# Patient Record
Sex: Male | Born: 1960 | Race: White | Hispanic: No | Marital: Married | Smoking: Former smoker
Health system: Southern US, Community
[De-identification: ages and names within clinical notes are randomized; demographics above are authoritative.]

## PROBLEM LIST (undated history)

## (undated) DIAGNOSIS — Z9889 Other specified postprocedural states: Secondary | ICD-10-CM

## (undated) DIAGNOSIS — I10 Essential (primary) hypertension: Secondary | ICD-10-CM

## (undated) DIAGNOSIS — E782 Mixed hyperlipidemia: Secondary | ICD-10-CM

## (undated) HISTORY — PX: OTHER SURGICAL HISTORY: SHX169

## (undated) HISTORY — DX: Essential (primary) hypertension: I10

## (undated) HISTORY — DX: Mixed hyperlipidemia: E78.2

## (undated) HISTORY — DX: Other specified postprocedural states: Z98.890

## (undated) HISTORY — PX: COLONOSCOPY W/ POLYPECTOMY: SHX1380

## (undated) HISTORY — PX: TONSILLECTOMY: SUR1361

## (undated) HISTORY — PX: SHOULDER ARTHROSCOPY: SHX128

---

## 1999-01-06 ENCOUNTER — Other Ambulatory Visit: Admission: RE | Admit: 1999-01-06 | Discharge: 1999-01-06 | Payer: Self-pay | Admitting: Otolaryngology

## 2005-07-31 ENCOUNTER — Encounter: Admission: RE | Admit: 2005-07-31 | Discharge: 2005-07-31 | Payer: Self-pay | Admitting: Internal Medicine

## 2006-07-04 ENCOUNTER — Inpatient Hospital Stay (HOSPITAL_COMMUNITY): Admission: RE | Admit: 2006-07-04 | Discharge: 2006-07-05 | Payer: Self-pay | Admitting: Specialist

## 2013-08-20 DIAGNOSIS — Z9889 Other specified postprocedural states: Secondary | ICD-10-CM

## 2013-08-20 HISTORY — DX: Other specified postprocedural states: Z98.890

## 2013-11-25 ENCOUNTER — Encounter: Payer: Self-pay | Admitting: Interventional Cardiology

## 2013-12-30 ENCOUNTER — Ambulatory Visit: Payer: Self-pay | Admitting: Interventional Cardiology

## 2014-01-21 ENCOUNTER — Ambulatory Visit (INDEPENDENT_AMBULATORY_CARE_PROVIDER_SITE_OTHER): Payer: BC Managed Care – PPO | Admitting: Interventional Cardiology

## 2014-01-21 ENCOUNTER — Encounter: Payer: Self-pay | Admitting: Interventional Cardiology

## 2014-01-21 VITALS — BP 142/82 | HR 85 | Ht 68.0 in | Wt 189.8 lb

## 2014-01-21 DIAGNOSIS — E782 Mixed hyperlipidemia: Secondary | ICD-10-CM | POA: Insufficient documentation

## 2014-01-21 DIAGNOSIS — Z8249 Family history of ischemic heart disease and other diseases of the circulatory system: Secondary | ICD-10-CM

## 2014-01-21 DIAGNOSIS — F17200 Nicotine dependence, unspecified, uncomplicated: Secondary | ICD-10-CM

## 2014-01-21 HISTORY — DX: Mixed hyperlipidemia: E78.2

## 2014-01-21 MED ORDER — FISH OIL 1000 MG PO CAPS: 2.0000 | ORAL_CAPSULE | Freq: Two times a day (BID) | ORAL | Status: AC

## 2014-01-21 NOTE — Progress Notes (Signed)
Patient ID: Ernest [name], male   DOB: 05/02/1961, 53 y.o.   MRN: [id number]    [address] [place], [place]  [address] Phone: [phone] Fax:  [phone]  Date:  01/21/2014   ID:  Ernest BrillRonald S [name], DOB 08/01/1961, MRN [id number]  PCP:  Darnelle BosSBORNE,JAMES CHARLES, MD      History of Present Illness: Ernest [name] is a 53 y.o. male who has had a strong family h/o CAD. His father died of an MI at age 54. His father was a heavy smoker. A year ago he had some left sided chest discomfort that would come and go quickly. It was on and off for over a day. No pain with climbing poles or walking.  He had an ETT and went 10 minutes without sx or ECG changes.   He has resumed smoking in 11/14.    Wt Readings from Last 3 Encounters:  01/21/14 189 lb 12.8 oz (86.093 kg)     Past Medical History  Diagnosis Date  . Hypertension     Current Outpatient Prescriptions  Medication Sig Dispense Refill  . aspirin 81 MG tablet Take 81 mg by mouth daily.      Marland Kitchen. atorvastatin (LIPITOR) 10 MG tablet Take 10 mg by mouth daily.      Marland Kitchen. buPROPion (WELLBUTRIN SR) 150 MG 12 hr tablet Take 150 mg by mouth 2 (two) times daily.      Marland Kitchen. zolpidem (AMBIEN) 5 MG tablet Take 5 mg by mouth at bedtime as needed for sleep.       No current facility-administered medications for this visit.    Allergies:   No Known Allergies  Social History:  The patient  reports that he has been smoking.  He does not have any smokeless tobacco history on file. He reports that he does not drink alcohol or use illicit drugs.   Family History:  The patient's family history includes Heart attack in his father and paternal uncle; Heart disease in his father and paternal uncle.   ROS:  Please see the history of present illness.  No nausea, vomiting.  No fevers, chills.  No focal weakness.  No dysuria.    All other systems reviewed and negative.   PHYSICAL EXAM: VS:  BP 142/82  Pulse 85  Ht 5\' 8"  (1.727 m)  Wt 189 lb  12.8 oz (86.093 kg)  BMI 28.87 kg/m2 Well nourished, well developed, in no acute distress HEENT: normal Neck: no JVD, no carotid bruits Cardiac:  normal S1, S2; RRR;  Lungs:  clear to auscultation bilaterally, no wheezing, rhonchi or rales Abd: soft, nontender, no hepatomegaly Ext: no edema Skin: warm and dry Neuro:   no focal abnormalities noted  EKG:  Normal   ASSESSMENT AND PLAN:  Chest pain  IMAGING: EC Stress Test [facility] in 2014 Notes: Atypical chest pain resolved. RF for CAD. Negative ETT in 2014, 10 minutes on treadmill.Marland Kitchen.  He will let us know if he has any further symptoms.    2. Hypercholesteremia  Continue Atorvastatin Calcium Tablet, 10 MG, 1 tablet, Orally, Once a day Notes: Would try to get LDL < 100. TG 343 and LDL 158 down to 94 at last check in 9/14.  But TG was 485 at that time.  Start fish oil 2 grams PO BID.   lipids will be checked with Dr. Kevan NyGates in a few months.    3. Family history of coronary artery disease /tobacco abuse Notes: Significant RF  for CAD. Father was a smoker as well. Patient is smoking. I encouraged him to abstain.  We spoke about tools to help him quit stop smoking. He wants to try cold [place]. He is unsuccessfully in the past. He feels that once the stress from his  Daughter has resolved, he will be able to stop smoking. [place].    Preventive Medicine  Adult topics discussed:  Diet: healthy diet.  Exercise: 5 days a week, at least 30 minutes of aerobic exercise.      Signed, Fredric MareJay S. Hans Rusher, MD, [facility] 01/21/2014 4:42 PM

## 2014-01-21 NOTE — Patient Instructions (Signed)
Your physician recommends that you return for a FASTING lipid profile with PCP as scheduled and have them fax results to us at [phone].  Your physician has recommended you make the following change in your medication:  1. Start Fish Oil 1,000 mg 2 capsules twice a day.  Your physician wants you to follow-up in: 1 year with Dr. Eldridge DaceVaranasi. You will receive a reminder letter in the mail two months in advance. If you don't receive a letter, please call our office to schedule the follow-up appointment.

## 2014-01-22 ENCOUNTER — Encounter: Payer: Self-pay | Admitting: Interventional Cardiology

## 2014-01-22 ENCOUNTER — Encounter: Payer: Self-pay | Admitting: Cardiology

## 2015-01-17 ENCOUNTER — Emergency Department (HOSPITAL_COMMUNITY)
Admission: EM | Admit: 2015-01-17 | Discharge: 2015-01-17 | Disposition: A | Discharge status: Home / Self Care | Payer: BLUE CROSS/BLUE SHIELD | Attending: Emergency Medicine | Admitting: Emergency Medicine

## 2015-01-17 ENCOUNTER — Emergency Department (HOSPITAL_COMMUNITY): Payer: BLUE CROSS/BLUE SHIELD

## 2015-01-17 ENCOUNTER — Encounter (HOSPITAL_COMMUNITY): Payer: Self-pay | Admitting: Emergency Medicine

## 2015-01-17 DIAGNOSIS — Z720 Tobacco use: Secondary | ICD-10-CM | POA: Insufficient documentation

## 2015-01-17 DIAGNOSIS — I10 Essential (primary) hypertension: Secondary | ICD-10-CM | POA: Insufficient documentation

## 2015-01-17 DIAGNOSIS — K5733 Diverticulitis of large intestine without perforation or abscess with bleeding: Secondary | ICD-10-CM | POA: Diagnosis not present

## 2015-01-17 DIAGNOSIS — E782 Mixed hyperlipidemia: Secondary | ICD-10-CM | POA: Diagnosis not present

## 2015-01-17 DIAGNOSIS — Z79899 Other long term (current) drug therapy: Secondary | ICD-10-CM | POA: Diagnosis not present

## 2015-01-17 DIAGNOSIS — K625 Hemorrhage of anus and rectum: Secondary | ICD-10-CM | POA: Diagnosis present

## 2015-01-17 DIAGNOSIS — K6289 Other specified diseases of anus and rectum: Secondary | ICD-10-CM

## 2015-01-17 LAB — COMPREHENSIVE METABOLIC PANEL
ALT: 25 U/L (ref 0–53)
AST: 21 U/L (ref 0–37)
Albumin: 3.8 g/dL (ref 3.5–5.2)
Alkaline Phosphatase: 89 U/L (ref 39–117)
Anion gap: 8 (ref 5–15)
BILIRUBIN TOTAL: 0.7 mg/dL (ref 0.3–1.2)
BUN: 7 mg/dL (ref 6–23)
CO2: 24 mmol/L (ref 19–32)
Calcium: 9.4 mg/dL (ref 8.4–10.5)
Chloride: 106 mmol/L (ref 96–112)
Creatinine, Ser: 1.11 mg/dL (ref 0.50–1.35)
GFR calc Af Amer: 85 mL/min — ABNORMAL LOW (ref >90)
GFR calc non Af Amer: 74 mL/min — ABNORMAL LOW (ref >90)
Glucose, Bld: 120 mg/dL — ABNORMAL HIGH (ref 70–99)
Potassium: 4.2 mmol/L (ref 3.5–5.1)
Sodium: 138 mmol/L (ref 135–145)
TOTAL PROTEIN: 6.3 g/dL (ref 6.0–8.3)

## 2015-01-17 LAB — CBC WITH DIFFERENTIAL/PLATELET
BASOS PCT: 0 % (ref 0–1)
Basophils Absolute: 0 10*3/uL (ref 0.0–0.1)
EOS PCT: 1 % (ref 0–5)
Eosinophils Absolute: 0.1 10*3/uL (ref 0.0–0.7)
HCT: 45.1 % (ref 39.0–52.0)
Hemoglobin: 15.8 g/dL (ref 13.0–17.0)
Lymphocytes Relative: 15 % (ref 12–46)
Lymphs Abs: 1.4 10*3/uL (ref 0.7–4.0)
MCH: 30.9 pg (ref 26.0–34.0)
MCHC: 35 g/dL (ref 30.0–36.0)
MCV: 88.3 fL (ref 78.0–100.0)
Monocytes Absolute: 0.5 10*3/uL (ref 0.1–1.0)
Monocytes Relative: 6 % (ref 3–12)
NEUTROS ABS: 7.2 10*3/uL (ref 1.7–7.7)
NEUTROS PCT: 78 % — AB (ref 43–77)
Platelets: 196 10*3/uL (ref 150–400)
RBC: 5.11 MIL/uL (ref 4.22–5.81)
RDW: 11.7 % (ref 11.5–15.5)
WBC: 9.2 10*3/uL (ref 4.0–10.5)

## 2015-01-17 LAB — LIPASE, BLOOD: Lipase: 102 U/L — ABNORMAL HIGH (ref 11–59)

## 2015-01-17 LAB — POC OCCULT BLOOD, ED: Fecal Occult Bld: POSITIVE — AB

## 2015-01-17 MED ORDER — ONDANSETRON HCL 4 MG PO TABS: 4.0000 mg | ORAL_TABLET | Freq: Four times a day (QID) | ORAL | Status: DC

## 2015-01-17 MED ORDER — ONDANSETRON HCL 4 MG/2ML IJ SOLN
4.0000 mg | Freq: Once | INTRAMUSCULAR | Status: AC
  Administered 2015-01-17: 4 mg via INTRAVENOUS
  Filled 2015-01-17: qty 2

## 2015-01-17 MED ORDER — IOHEXOL 300 MG/ML  SOLN
25.0000 mL | Freq: Once | INTRAMUSCULAR | Status: AC | PRN
  Administered 2015-01-17: 25 mL via ORAL

## 2015-01-17 MED ORDER — IOHEXOL 300 MG/ML  SOLN
100.0000 mL | Freq: Once | INTRAMUSCULAR | Status: AC | PRN
  Administered 2015-01-17: 100 mL via INTRAVENOUS

## 2015-01-17 MED ORDER — MORPHINE SULFATE 4 MG/ML IJ SOLN
4.0000 mg | INTRAMUSCULAR | Status: DC | PRN
  Administered 2015-01-17: 4 mg via INTRAVENOUS
  Filled 2015-01-17: qty 1

## 2015-01-17 MED ORDER — METRONIDAZOLE 500 MG PO TABS: 500.0000 mg | ORAL_TABLET | Freq: Three times a day (TID) | ORAL | Status: DC

## 2015-01-17 MED ORDER — HYDROCODONE-ACETAMINOPHEN 5-325 MG PO TABS: 1.0000 | ORAL_TABLET | ORAL | Status: DC | PRN

## 2015-01-17 MED ORDER — CIPROFLOXACIN HCL 500 MG PO TABS: 500.0000 mg | ORAL_TABLET | Freq: Two times a day (BID) | ORAL | Status: DC

## 2015-01-17 MED ORDER — SODIUM CHLORIDE 0.9 % IV BOLUS (SEPSIS)
1000.0000 mL | Freq: Once | INTRAVENOUS | Status: AC
  Administered 2015-01-17: 1000 mL via INTRAVENOUS

## 2015-01-17 NOTE — ED Notes (Signed)
Notified CT patient finished with PO contrast. 

## 2015-01-17 NOTE — ED Notes (Signed)
Patient returned from CT

## 2015-01-17 NOTE — Discharge Instructions (Signed)
Return to the emergency room with worsening of symptoms, new symptoms or with symptoms that are concerning , especially fevers, worse abdominal pain in one area, unable to keep down fluids, gross/frank blood in stool or vomit, severe pain, you feel faint, lightheaded or pass out. Please take all of your antibiotics until finished!   You may develop abdominal discomfort or diarrhea from the antibiotic.  You may help offset this with probiotics which you can buy or get in yogurt. Do not eat  or take the probiotics until 2 hours after your antibiotic.  Please call your doctor for a followup appointment within 24-48 hours. When you talk to your doctor please let them know that you were seen in the emergency department and have them acquire all of your records so that they can discuss the findings with you and formulate a treatment plan to fully care for your new and ongoing problems. If you do not have a primary care provider please call the number below under ED resources to establish care with a provider and follow up.  Make appointment as soon as possible for follow-up with [facility] for next week.  Read below information and follow recommendations. Diverticulitis Diverticulitis is inflammation or infection of small pouches in your colon that form when you have a condition called diverticulosis. The pouches in your colon are called diverticula. Your colon, or large intestine, is where water is absorbed and stool is formed. Complications of diverticulitis can include:  Bleeding.  Severe infection.  Severe pain.  Perforation of your colon.  Obstruction of your colon. CAUSES  Diverticulitis is caused by bacteria. Diverticulitis happens when stool becomes trapped in diverticula. This allows bacteria to grow in the diverticula, which can lead to inflammation and infection. RISK FACTORS People with diverticulosis are at risk for diverticulitis. Eating a diet that does not include enough fiber from  fruits and vegetables may make diverticulitis more likely to develop. SYMPTOMS  Symptoms of diverticulitis may include:  Abdominal pain and tenderness. The pain is normally located on the left side of the abdomen, but may occur in other areas.  Fever and chills.  Bloating.  Cramping.  Nausea.  Vomiting.  Constipation.  Diarrhea.  Blood in your stool. DIAGNOSIS  Your health care provider will ask you about your medical history and do a physical exam. You may need to have tests done because many medical conditions can cause the same symptoms as diverticulitis. Tests may include:  Blood tests.  Urine tests.  Imaging tests of the abdomen, including X-rays and CT scans. When your condition is under control, your health care provider may recommend that you have a colonoscopy. A colonoscopy can show how severe your diverticula are and whether something else is causing your symptoms. TREATMENT  Most cases of diverticulitis are mild and can be treated at home. Treatment may include:  Taking over-the-counter pain medicines.  Following a clear liquid diet.  Taking antibiotic medicines by mouth for 7-10 days. More severe cases may be treated at a hospital. Treatment may include:  Not eating or drinking.  Taking prescription pain medicine.  Receiving antibiotic medicines through an IV tube.  Receiving fluids and nutrition through an IV tube.  Surgery. HOME CARE INSTRUCTIONS   Follow your health care provider's instructions carefully.  Follow a full liquid diet or other diet as directed by your health care provider. After your symptoms improve, your health care provider may tell you to change your diet. He or she may recommend you  eat a high-fiber diet. Fruits and vegetables are good sources of fiber. Fiber makes it easier to pass stool.  Take fiber supplements or probiotics as directed by your health care provider.  Only take medicines as directed by your health care  provider.  Keep all your follow-up appointments. SEEK MEDICAL CARE IF:   Your pain does not improve.  You have a hard time eating food.  Your bowel movements do not return to normal. SEEK IMMEDIATE MEDICAL CARE IF:   Your pain becomes worse.  Your symptoms do not get better.  Your symptoms suddenly get worse.  You have a fever.  You have repeated vomiting.  You have bloody or black, tarry stools. MAKE SURE YOU:   Understand these instructions.  Will watch your condition.  Will get help right away if you are not doing well or get worse. Document Released: 06/21/2005 Document Revised: 09/16/2013 Document Reviewed: 08/06/2013 [facility] Patient Information 2015 [place], [place]. This information is not intended to replace advice given to you by your health care provider. Make sure you discuss any questions you have with your health care provider.

## 2015-01-17 NOTE — ED Provider Notes (Signed)
CSN: [id number]     Arrival date & time 01/17/15  1113 History   First MD Initiated Contact with Patient 01/17/15 1122     Chief Complaint  Patient presents with  . Rectal Bleeding     (Consider location/radiation/quality/duration/timing/severity/associated sxs/prior Treatment) HPI  Ernest [name] is a [age] y.o. male with PMH of hypertension, dyslipidemia, IBS presenting with lower abdominal cramping at 2 AM followed by black stool. Patient reported looser stool every 30 minutes that was dark and at 5 AM became bright red. Abdominal pain perceived diarrhea resolves. He has not taken anything for his symptoms. Patient endorses taking Pepto-Bismol yesterday but is not taking the iron pills. Patient reports intermittent nausea but no vomiting. He's never had rectal bleeding, hemorrhoids. He denies any rectal pain. Denies any pain at this time. He is not on any blood thinners. Denies history of GI bleeds. No NSAID use or alcohol use. Patient reports last colonoscopy 1-2 years ago was unremarkable. He denies history of abdominal surgeries.   Past Medical History  Diagnosis Date  . Hypertension   . Mixed hyperlipidemia 01/21/2014  . History of colonoscopy 08/20/13   Past Surgical History  Procedure Laterality Date  . Shoulder arthroscopy    . Tonselec      tonsillectomy   Family History  Problem Relation Age of Onset  . Heart disease Father   . Heart attack Father   . Heart disease Paternal Uncle   . Heart attack Paternal Uncle    History  Substance Use Topics  . Smoking status: Current Every Day Smoker -- 1.00 packs/day  . Smokeless tobacco: Not on file  . Alcohol Use: No    Review of Systems 10 Systems reviewed and are negative for acute change except as noted in the HPI.    Allergies  Review of patient's allergies indicates no known allergies.  Home Medications   Prior to Admission medications   Medication Sig Start Date End Date Taking? Authorizing Provider  aspirin  81 MG tablet Take 81 mg by mouth daily.   Yes Historical Provider, MD  atorvastatin (LIPITOR) 10 MG tablet Take 10 mg by mouth daily.   Yes Historical Provider, MD  buPROPion (WELLBUTRIN SR) 150 MG 12 hr tablet Take 150 mg by mouth 2 (two) times daily.   Yes Historical Provider, MD  levothyroxine (SYNTHROID, LEVOTHROID) 75 MCG tablet Take 75 mcg by mouth daily. 12/29/14  Yes Historical Provider, MD  Omega-3 Fatty Acids (FISH OIL) 1000 MG CAPS Take 2 capsules (2,000 mg total) by mouth 2 (two) times daily. 01/21/14  Yes Corky CraftsJayadeep S Varanasi, MD  zolpidem (AMBIEN) 5 MG tablet Take 5 mg by mouth at bedtime as needed for sleep.   Yes Historical Provider, MD  ciprofloxacin (CIPRO) 500 MG tablet Take 1 tablet (500 mg total) by mouth 2 (two) times daily. 01/17/15   Oswaldo ConroyVictoria Anndrea Mihelich, PA-C  HYDROcodone-acetaminophen (NORCO/VICODIN) 5-325 MG per tablet Take 1-2 tablets by mouth every 4 (four) hours as needed. 01/17/15   Oswaldo ConroyVictoria Nitasha Jewel, PA-C  metroNIDAZOLE (FLAGYL) 500 MG tablet Take 1 tablet (500 mg total) by mouth 3 (three) times daily. 01/17/15   Oswaldo ConroyVictoria Rylynne Schicker, PA-C  ondansetron (ZOFRAN) 4 MG tablet Take 1 tablet (4 mg total) by mouth every 6 (six) hours. 01/17/15   Oswaldo ConroyVictoria Agam Tuohy, PA-C   BP 142/83 mmHg  Pulse 64  Temp(Src) 98 F (36.7 C) (Oral)  Resp 13  Ht 5\' 10"  (1.778 m)  Wt 192 lb (87.091 kg)  BMI 27.55 kg/m2  SpO2 94% Physical Exam  Constitutional: He appears well-developed and well-nourished. No distress.  HENT:  Head: Normocephalic and atraumatic.  Dry mucous membranes  Eyes: Conjunctivae and EOM are normal. Right eye exhibits no discharge. Left eye exhibits no discharge.  Cardiovascular: Normal rate and regular rhythm.   Pulmonary/Chest: Effort normal and breath sounds normal. No respiratory distress. He has no wheezes.  Abdominal: Soft. Bowel sounds are normal. He exhibits no distension.  Right and left lower quadrant abdominal tenderness without rebound, rigidity, guarding.  Genitourinary:   External rectum without tenderness to palpation without visible fissures. Internal rectum with normal tone without palpated masses or lesions. Stool brown without gross blood. Nurse in room during exam.   Neurological: He is alert. He exhibits normal muscle tone. Coordination normal.  Skin: Skin is warm and dry. He is not diaphoretic.  Nursing note and vitals reviewed.   ED Course  Procedures (including critical care time) Labs Review Labs Reviewed  CBC WITH DIFFERENTIAL/PLATELET - Abnormal; Notable for the following:    Neutrophils Relative % 78 (*)    All other components within normal limits  COMPREHENSIVE METABOLIC PANEL - Abnormal; Notable for the following:    Glucose, Bld 120 (*)    GFR calc non Af Amer 74 (*)    GFR calc Af Amer 85 (*)    All other components within normal limits  LIPASE, BLOOD - Abnormal; Notable for the following:    Lipase 102 (*)    All other components within normal limits  POC OCCULT BLOOD, ED - Abnormal; Notable for the following:    Fecal Occult Bld POSITIVE (*)    All other components within normal limits    Imaging Review Ct Abdomen Pelvis W Contrast  01/17/2015   CLINICAL DATA:  Rectal bleeding. Lower abdominal pain with bowel movements.  EXAM: CT ABDOMEN AND PELVIS WITH CONTRAST  TECHNIQUE: Multidetector CT imaging of the abdomen and pelvis was performed using the standard protocol following bolus administration of intravenous contrast.  CONTRAST:  OMNIPAQUE IOHEXOL 300 MG/ML  SOLN  COMPARISON:  None.  FINDINGS: Mild diffuse low density of the liver relative to the spleen. Small to moderate-sized sliding hiatal hernia.  Concentric wall thickening involving the distal descending and proximal sigmoid colon with questionable minimal adjacent pericolonic soft tissue stranding. Questionable small diverticula in that region as well as elsewhere in the colon. No fluid collections or free peritoneal air.  There is also a suggestion of an eccentric mass  in the mid to upper rectum posteriorly and on the right. This is best seen on axial image number 65 and coronal image number 95.  Normal appearing appendix. No small bowel abnormalities. No enlarged lymph nodes.  Normal appearing spleen, pancreas, gallbladder, right adrenal glands, right kidney and urinary bladder. Mildly enlarged prostate gland containing a small amount of coarse calcification. Tiny left renal cysts. Left adrenal mass measuring 2.0 x 1.9 cm on image 19.  Of the minimal dependent atelectasis at both lung bases. Mild lumbar and lower thoracic spine degenerative changes. Right L5 pars interarticularis defect. Mild anterolisthesis at the L5-S1 level. The left L5 pars interarticularis is intact.  IMPRESSION: 1. Possible minimal segmental colitis or diverticulitis involving the distal descending colon and proximal sigmoid colon. 2. Possible eccentric mass in the mid mid to upper rectum, concerning for the possibility of a rectal neoplasm. Further evaluation with sigmoidoscopy or colonoscopy is recommended to evaluate this area as well as the distal descending and proximal sigmoid colon. 3.  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 A metastasis is less likely. 4. Small to moderate-sized hiatal hernia. 5. Mild diffuse hepatic steatosis. 6. 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   Electronically Signed   By: Beckie Salts M.D.   On: 01/17/2015 14:23     EKG Interpretation None      Meds given in ED:  Medications  morphine 4 MG/ML injection 4 mg (4 mg Intravenous Given 01/17/15 1321)  sodium chloride 0.9 % bolus 1,000 mL (0 mLs Intravenous Stopped 01/17/15 1323)  ondansetron (ZOFRAN) injection 4 mg (4 mg Intravenous Given 01/17/15 1146)  iohexol (OMNIPAQUE) 300 MG/ML solution 25 mL (25 mLs Oral Contrast Given 01/17/15 1201)  iohexol (OMNIPAQUE) 300 MG/ML solution 100 mL (100 mLs Intravenous Contrast Given 01/17/15 1254)     New Prescriptions   CIPROFLOXACIN (CIPRO) 500 MG TABLET    Take 1 tablet (500 mg total) by mouth 2 (two) times daily.   HYDROCODONE-ACETAMINOPHEN (NORCO/VICODIN) 5-325 MG PER TABLET    Take 1-2 tablets by mouth every 4 (four) hours as needed.   METRONIDAZOLE (FLAGYL) 500 MG TABLET    Take 1 tablet (500 mg total) by mouth 3 (three) times daily.   ONDANSETRON (ZOFRAN) 4 MG TABLET    Take 1 tablet (4 mg total) by mouth every 6 (six) hours.      MDM   Final diagnoses:  Diverticulitis of large intestine without perforation or abscess with bleeding  Rectal mass   Patient presenting with one-day history of diarrhea and rectal bleeding with lower abdominal pain. VSS. She and afebrile. Abdomen with minimal lower abdominal tenderness without any evidence of peritonitis. Laboratory workup significant for lipase 102. No leukocytosis or electrolyte abnormalities. Stable hemoglobin at 15.8 with positive fecal call. CT with evidence of minimal segmental colitis/diverticulitis of descending colon with mass to rectum. This is concerning for rectal neoplasm. Patient denying any pain at this time and abdomen without any tenderness. Consult to GI spoke with Dr. Dulce Sellar with [facility] patient had had colonoscopy 1 year ago that was negative. He recommended Cipro and Flagyl for 14 days and close follow-up in the office with Dr. Laural Benes in 1 week with plan for sigmoidoscopy to further evaluate rectal mass. Patient is nontoxic nonseptic appearing and is stable for discharge.  Discussed return precautions with patient including fevers, nausea, vomiting, return of abdominal pain, gross rectal bleeding. Discussed all results and patient verbalizes understanding and agrees with plan.  Case has been discussed with Dr. Elesa Massed who agrees with the above plan and to discharge.     Oswaldo Conroy, PA-C 01/17/15 1526  Layla Maw Ward, DO 01/17/15 1531

## 2015-01-17 NOTE — ED Notes (Signed)
Reported another red bloody stool.

## 2015-01-17 NOTE — ED Notes (Signed)
Woke up about 0200 with stomach pain; stool was black. Repeat trips to bathroom with black stool; then at 0500 stool turned to bright red. 12 stools since 0200 with lower abdominal pain prior to each bowel movement. Not on blood thinners. Denies history of GI bleeds.

## 2015-02-04 ENCOUNTER — Other Ambulatory Visit: Payer: Self-pay | Admitting: Gastroenterology

## 2015-02-11 ENCOUNTER — Encounter: Payer: Self-pay | Admitting: Interventional Cardiology

## 2015-02-11 ENCOUNTER — Ambulatory Visit (INDEPENDENT_AMBULATORY_CARE_PROVIDER_SITE_OTHER): Payer: BLUE CROSS/BLUE SHIELD | Admitting: Interventional Cardiology

## 2015-02-11 VITALS — BP 114/82 | HR 83 | Ht 70.0 in | Wt 179.1 lb

## 2015-02-11 DIAGNOSIS — Z720 Tobacco use: Secondary | ICD-10-CM

## 2015-02-11 DIAGNOSIS — E782 Mixed hyperlipidemia: Secondary | ICD-10-CM | POA: Diagnosis not present

## 2015-02-11 DIAGNOSIS — Z8249 Family history of ischemic heart disease and other diseases of the circulatory system: Secondary | ICD-10-CM

## 2015-02-11 DIAGNOSIS — F17200 Nicotine dependence, unspecified, uncomplicated: Secondary | ICD-10-CM

## 2015-02-11 NOTE — Patient Instructions (Signed)
Medication Instructions:  Same  Labwork: None  Testing/Procedures: None  Follow-Up: Your physician wants you to follow-up in: 1 year. You will receive a reminder letter in the mail two months in advance. If you don't receive a letter, please call our office to schedule the follow-up appointment.      

## 2015-02-11 NOTE — Progress Notes (Signed)
Patient ID: Ernest Ernest [name], male   DOB: 01/10/1961, 54 y.o.   MRN: [id number]     Cardiology Office Note   Date:  02/12/2015   ID:  Ernest Ernest [name], DOB 08/22/1961, MRN [id number]  PCP:  Darnelle BosSBORNE,JAMES CHARLES, MD    No chief complaint on file. RF for CAD   Wt Readings from Last 3 Encounters:  02/11/15 179 lb 1.9 oz (81.248 kg)  01/17/15 192 lb (87.091 kg)  01/21/14 189 lb 12.8 oz (86.093 kg)       History of Present Illness: Ernest Ernest [name] Rennaker is a 54 y.o. male  who has had a strong family h/o CAD. His father died of an MI at age 55. His father was a heavy smoker. A few years ago he had some left sided chest discomfort that would come and go quickly. It was on and off for over a day. No pain with climbing poles or walking. He had an ETT and went 10 minutes without sx or ECG changes.   He has resumed smoking in 11/14.  He still has not quit.  No recent chest pain.  His job is physically demanding.    Past Medical History  Diagnosis Date  . Hypertension   . Mixed hyperlipidemia 01/21/2014  . History of colonoscopy 08/20/13    Past Surgical History  Procedure Laterality Date  . Shoulder arthroscopy    . Tonselec      tonsillectomy     Current Outpatient Prescriptions  Medication Sig Dispense Refill  . atorvastatin (LIPITOR) 10 MG tablet Take 10 mg by mouth daily.    Marland Kitchen. buPROPion (WELLBUTRIN SR) 150 MG 12 hr tablet Take 150 mg by mouth 2 (two) times daily.    Marland Kitchen. levothyroxine (SYNTHROID, LEVOTHROID) 75 MCG tablet Take 75 mcg by mouth daily.  12  . Omega-3 Fatty Acids (FISH OIL) 1000 MG CAPS Take 2 capsules (2,000 mg total) by mouth 2 (two) times daily.  0  . zolpidem (AMBIEN) 5 MG tablet Take 5 mg by mouth at bedtime as needed for sleep.     No current facility-administered medications for this visit.    Allergies:   Review of patient'Ernest [name] allergies indicates no known allergies.    Social History:  The patient  reports that he has been smoking.  He does not have any  smokeless tobacco history on file. He reports that he does not drink alcohol or use illicit drugs.   Family History:  The patient'Ernest [name] family history includes Heart attack in his father and paternal uncle; Heart disease in his father and paternal uncle.    ROS:  Please see the history of present illness.   Otherwise, review of systems are positive for diverticulitis.Marland Kitchen. aspirin stopped due to GI bleeding.   All other systems are reviewed and negative.    PHYSICAL EXAM: VS:  BP 114/82 mmHg  Pulse 83  Ht 5\' 10"  (1.778 m)  Wt 179 lb 1.9 oz (81.248 kg)  BMI 25.70 kg/m2 , BMI Body mass index is 25.7 kg/(m^2). GEN: Well nourished, well developed, in no acute distress HEENT: normal Neck: no JVD, carotid bruits, or masses Cardiac: *RRR; no murmurs, rubs, or gallops,no edema  Respiratory:  clear to auscultation bilaterally, normal work of breathing GI: soft, nontender, nondistended, + BS MS: no deformity or atrophy Skin: warm and dry, no rash Neuro:  Strength and sensation are intact Psych: euthymic mood, full affect   EKG:   The ekg ordered today demonstrates Normal   Recent  Labs: 01/17/2015: ALT 25; BUN 7; Creatinine 1.11; Hemoglobin 15.8; Platelets 196; Potassium 4.2; Sodium 138   Lipid Panel No results found for: CHOL, TRIG, HDL, CHOLHDL, VLDL, LDLCALC, LDLDIRECT   Other studies Reviewed: Additional studies/ records that were reviewed today with results demonstrating: abdominal CT.  No evidence of appendicitis.  No vascular disease mentioned.  ? Rectal mass.  Colonoscopy recommended.   ASSESSMENT AND PLAN:   Chest pain   Notes: Atypical chest pain resolved. RF for CAD. Negative ETT in 2014, 10 minutes on treadmill.Marland Kitchen. He will let us know if he has any further symptoms.    2. Hypercholesteremia  Continue Atorvastatin Calcium Tablet, 10 MG, 1 tablet, Orally, Once a day Notes: Would try to get LDL < 100. TG 343 and LDL 158 down to 94 at last check in 9/14. But TG was 485 at that  time. Started fish oil 2 grams PO BID. lipids were be checked with Dr. Kevan NyGates  a few months ago.  Obtain these results..    3. Family history of coronary artery disease /tobacco abuse Notes: Significant RF for CAD. Father was a smoker as well. Patient is smoking. I encouraged him to abstain. We spoke about tools to help him quit stop smoking. He wants to try cold [place]. He is unsuccessful in the past.  He wants to try to do it on his own.         GI bleeding.  Colonoscopy pending.  Decide on aspirin dosing after the colonoscopy.  ? Of rectal mass.   Current medicines are reviewed at length with the patient today.  The patient concerns regarding his medicines were addressed.  The following changes have been made:  No change  Labs/ tests ordered today include:    Orders Placed This Encounter  Procedures  . EKG 12-Lead    Recommend 150 minutes/week of aerobic exercise Low fat, low carb, high fiber diet recommended  Disposition:   FU in 1 year   Delorise JacksonSigned, Cordale Manera Ernest [name]., MD  02/12/2015 1:21 PM    [facility] [address], [place], [place]  [address] Phone: [phone](336) [phone]; Fax: [phone]

## 2015-02-25 ENCOUNTER — Encounter (HOSPITAL_COMMUNITY): Payer: Self-pay | Admitting: *Deleted

## 2015-03-04 ENCOUNTER — Other Ambulatory Visit: Payer: Self-pay | Admitting: Gastroenterology

## 2015-03-08 ENCOUNTER — Ambulatory Visit (HOSPITAL_COMMUNITY): Payer: BLUE CROSS/BLUE SHIELD | Admitting: Registered Nurse

## 2015-03-08 ENCOUNTER — Ambulatory Visit (HOSPITAL_COMMUNITY)
Admission: RE | Admit: 2015-03-08 | Discharge: 2015-03-08 | Disposition: A | Discharge status: Home / Self Care | Payer: BLUE CROSS/BLUE SHIELD | Source: Ambulatory Visit | Attending: Gastroenterology | Admitting: Gastroenterology

## 2015-03-08 ENCOUNTER — Encounter (HOSPITAL_COMMUNITY): Payer: Self-pay

## 2015-03-08 ENCOUNTER — Encounter (HOSPITAL_COMMUNITY)
Admission: RE | Disposition: A | Discharge status: Home / Self Care | Payer: Self-pay | Source: Ambulatory Visit | Attending: Gastroenterology

## 2015-03-08 DIAGNOSIS — F17210 Nicotine dependence, cigarettes, uncomplicated: Secondary | ICD-10-CM | POA: Insufficient documentation

## 2015-03-08 DIAGNOSIS — Z8719 Personal history of other diseases of the digestive system: Secondary | ICD-10-CM | POA: Insufficient documentation

## 2015-03-08 DIAGNOSIS — Z09 Encounter for follow-up examination after completed treatment for conditions other than malignant neoplasm: Secondary | ICD-10-CM | POA: Insufficient documentation

## 2015-03-08 DIAGNOSIS — Z86010 Personal history of colonic polyps: Secondary | ICD-10-CM | POA: Insufficient documentation

## 2015-03-08 DIAGNOSIS — E780 Pure hypercholesterolemia: Secondary | ICD-10-CM | POA: Insufficient documentation

## 2015-03-08 DIAGNOSIS — I10 Essential (primary) hypertension: Secondary | ICD-10-CM | POA: Insufficient documentation

## 2015-03-08 HISTORY — PX: COLONOSCOPY WITH PROPOFOL: SHX5780

## 2015-03-08 SURGERY — COLONOSCOPY WITH PROPOFOL
Anesthesia: Monitor Anesthesia Care

## 2015-03-08 MED ORDER — SODIUM CHLORIDE 0.9 % IV SOLN: INTRAVENOUS | Status: DC

## 2015-03-08 MED ORDER — LIDOCAINE HCL (CARDIAC) 20 MG/ML IV SOLN
INTRAVENOUS | Status: DC | PRN
  Administered 2015-03-08: 100 mg via INTRAVENOUS

## 2015-03-08 MED ORDER — LACTATED RINGERS IV SOLN
INTRAVENOUS | Status: DC | PRN
  Administered 2015-03-08: 07:00:00 via INTRAVENOUS

## 2015-03-08 MED ORDER — PROPOFOL 10 MG/ML IV BOLUS
INTRAVENOUS | Status: AC
  Filled 2015-03-08: qty 20

## 2015-03-08 MED ORDER — PROPOFOL INFUSION 10 MG/ML OPTIME
INTRAVENOUS | Status: DC | PRN
  Administered 2015-03-08: 160 ug/kg/min via INTRAVENOUS

## 2015-03-08 MED ORDER — LIDOCAINE HCL (CARDIAC) 20 MG/ML IV SOLN
INTRAVENOUS | Status: AC
  Filled 2015-03-08: qty 5

## 2015-03-08 SURGICAL SUPPLY — 21 items

## 2015-03-08 NOTE — Anesthesia Postprocedure Evaluation (Signed)
  Anesthesia Post-op Note  Patient: Ernest [name]  Procedure(s) Performed: Procedure(s) (LRB): COLONOSCOPY WITH PROPOFOL (N/A)  Patient Location: PACU  Anesthesia Type: MAC  Level of Consciousness: awake and alert   Airway and Oxygen Therapy: Patient Spontanous Breathing  Post-op Pain: mild  Post-op Assessment: Post-op Vital signs reviewed, Patient's Cardiovascular Status Stable, Respiratory Function Stable, Patent Airway and No signs of Nausea or vomiting  Last Vitals:  Filed Vitals:   03/08/15 0822  BP: 125/77  Pulse: 57  Temp: 36.6 C  Resp: 15    Post-op Vital Signs: stable   Complications: No apparent anesthesia complications
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
## 2015-03-08 NOTE — Discharge Instructions (Signed)

## 2015-03-08 NOTE — Op Note (Signed)
Problem: Resolved acute sigmoid colon diverticulitis. 01/17/2015 CT scan of the abdomen and pelvis showed concentric wall thickening involving the distal descending colon and proximal sigmoid colon, the suggestion of an eccentric mass in the upper rectum posteriorly, hepatic steatosis, and a 2 cm adrenal incidentaloma. 08/22/2013 normal surveillance colonoscopy performed.  Endoscopist: Danise Edge  Premedication: Propofol administered by anesthesia  Procedure: The patient was placed in the left lateral decubitus position. Anal inspection and digital rectal exam were normal. The Pentax pediatric colonoscope was introduced into the rectum and advanced to the cecum. A normal-appearing appendiceal orifice and ileocecal valve were identified. Colonic preparation exam today was good. Withdrawal time was 15 minutes.  Rectum. Normal. Retroflexed view of the distal rectum was normal  Sigmoid colon and descending colon. Normal  Splenic flexure. Normal  Transverse colon. Normal  Hepatic flexure. Normal  Ascending colon. Normal  Cecum and ileocecal valve. Normal  Assessment: Normal diagnostic colonoscopy following treatment of acute sigmoid colon diverticulitis.  Recommendation: Schedule surveillance colonoscopy in 5 years

## 2015-03-08 NOTE — Anesthesia Preprocedure Evaluation (Addendum)
Anesthesia Evaluation  Patient identified by MRN, date of birth, ID band Patient awake    Reviewed: Allergy & Precautions, H&P , NPO status , Patient's Chart, lab work & pertinent test results  Airway Mallampati: II  TM Distance: >3 FB Neck ROM: full    Dental no notable dental hx. (+) Teeth Intact, Dental Advisory Given   Pulmonary Current Smoker,  breath sounds clear to auscultation  Pulmonary exam normal       Cardiovascular Exercise Tolerance: Good hypertension, negative cardio ROS Normal cardiovascular examRhythm:regular Rate:Normal     Neuro/Psych negative neurological ROS  negative psych ROS   GI/Hepatic negative GI ROS, Neg liver ROS,   Endo/Other  negative endocrine ROS  Renal/GU negative Renal ROS  negative genitourinary   Musculoskeletal   Abdominal   Peds  Hematology negative hematology ROS (+)   Anesthesia Other Findings   Reproductive/Obstetrics negative OB ROS                            Anesthesia Physical Anesthesia Plan  ASA: II  Anesthesia Plan: MAC   Post-op Pain Management:    Induction: Intravenous  Airway Management Planned: Simple Face Mask  Additional Equipment:   Intra-op Plan:   Post-operative Plan: Extubation in OR  Informed Consent: I have reviewed the patients History and Physical, chart, labs and discussed the procedure including the risks, benefits and alternatives for the proposed anesthesia with the patient or authorized representative who has indicated his/her understanding and acceptance.   Dental Advisory Given  Plan Discussed with: CRNA and Surgeon  Anesthesia Plan Comments:        Anesthesia Quick Evaluation

## 2015-03-08 NOTE — Transfer of Care (Signed)
Immediate Anesthesia Transfer of Care Note  Patient: Ernest [name]  Procedure(s) Performed: Procedure(s): COLONOSCOPY WITH PROPOFOL (N/A)  Patient Location: PACU  Anesthesia Type:MAC  Level of Consciousness: awake, alert , oriented and patient cooperative  Airway & Oxygen Therapy: Patient Spontanous Breathing and Patient connected to face mask oxygen  Post-op Assessment: Report given to RN, Post -op Vital signs reviewed and stable and Patient moving all extremities  Post vital signs: Reviewed and stable  Last Vitals:  Filed Vitals:   03/08/15 0626  BP: 143/79  Pulse: 72  Temp: 36.6 C  Resp: 11    Complications: No apparent anesthesia complications

## 2015-03-08 NOTE — Anesthesia Procedure Notes (Signed)
Procedure Name: MAC Date/Time: 03/08/2015 7:46 AM Performed by: Jarvis Newcomer A Pre-anesthesia Checklist: Patient identified, Timeout performed, Emergency Drugs available, Suction available and Patient being monitored Patient Re-evaluated:Patient Re-evaluated prior to inductionOxygen Delivery Method: Simple face mask Dental Injury: Teeth and Oropharynx as per pre-operative assessment

## 2015-03-09 ENCOUNTER — Encounter (HOSPITAL_COMMUNITY): Payer: Self-pay | Admitting: Gastroenterology

## 2015-05-11 ENCOUNTER — Other Ambulatory Visit: Payer: Self-pay | Admitting: Internal Medicine

## 2015-05-11 DIAGNOSIS — E278 Other specified disorders of adrenal gland: Secondary | ICD-10-CM

## 2015-05-11 DIAGNOSIS — E279 Disorder of adrenal gland, unspecified: Principal | ICD-10-CM

## 2015-07-12 ENCOUNTER — Ambulatory Visit
Admission: RE | Admit: 2015-07-12 | Discharge: 2015-07-12 | Disposition: A | Discharge status: Home / Self Care | Payer: BLUE CROSS/BLUE SHIELD | Source: Ambulatory Visit | Attending: Internal Medicine | Admitting: Internal Medicine

## 2015-07-12 DIAGNOSIS — E278 Other specified disorders of adrenal gland: Secondary | ICD-10-CM

## 2015-07-12 DIAGNOSIS — E279 Disorder of adrenal gland, unspecified: Principal | ICD-10-CM

## 2016-02-07 ENCOUNTER — Ambulatory Visit (INDEPENDENT_AMBULATORY_CARE_PROVIDER_SITE_OTHER): Payer: BLUE CROSS/BLUE SHIELD | Admitting: Interventional Cardiology

## 2016-02-07 ENCOUNTER — Encounter: Payer: Self-pay | Admitting: Interventional Cardiology

## 2016-02-07 VITALS — BP 142/90 | HR 64 | Ht 70.0 in | Wt 196.0 lb

## 2016-02-07 DIAGNOSIS — Z8249 Family history of ischemic heart disease and other diseases of the circulatory system: Secondary | ICD-10-CM

## 2016-02-07 DIAGNOSIS — E782 Mixed hyperlipidemia: Secondary | ICD-10-CM | POA: Diagnosis not present

## 2016-02-07 DIAGNOSIS — F17200 Nicotine dependence, unspecified, uncomplicated: Secondary | ICD-10-CM | POA: Diagnosis not present

## 2016-02-07 NOTE — Progress Notes (Signed)
Patient ID: BUTLER VEGH, male   DOB: June 02, 1961, 55 y.o.   MRN: [id number]     Cardiology Office Note   Date:  02/07/2016   ID:  Ernest [name], DOB March 21, 1961, MRN [id number]  PCP:  Pearla Dubonnet, MD    No chief complaint on file. f/u RF for CAD   Wt Readings from Last 3 Encounters:  02/07/16 196 lb (88.905 kg)  03/08/15 185 lb (83.915 kg)  02/11/15 179 lb 1.9 oz (81.248 kg)       History of Present Illness: Ernest [name] is a 55 y.o. male  who has had a strong family h/o CAD. His father died of an MI at age 105. His father was a heavy smoker. In 2014, he had some left sided chest discomfort that would come and go quickly. It was on and off for over a day. No pain with climbing poles or walking. He had an ETT and went 10 minutes without sx or ECG changes.   His only other exercise is push mowing.  No problems with this activity. His job is his most stressful activity physically.  He is keeping up the same exercise tolerance at work.  No chest pain, SHOB, palpitations, dizziness, syncope.  BP is usually higher in the MDs office.  At home, he gets readings in the 115 range.      Past Medical History  Diagnosis Date  . Hypertension   . Mixed hyperlipidemia 01/21/2014  . History of colonoscopy 08/20/13    Past Surgical History  Procedure Laterality Date  . Shoulder arthroscopy Right   . Tonselec      tonsillectomy  . Colonoscopy w/ polypectomy    . Tonsillectomy    . Colonoscopy with propofol N/A 03/08/2015    Procedure: COLONOSCOPY WITH PROPOFOL;  Surgeon: Charolett Bumpers, MD;  Location: [facility];  Service: Endoscopy;  Laterality: N/A;     Current Outpatient Prescriptions  Medication Sig Dispense Refill  . atorvastatin (LIPITOR) 10 MG tablet Take 10 mg by mouth daily.    Marland Kitchen buPROPion (WELLBUTRIN SR) 150 MG 12 hr tablet Take 150 mg by mouth 2 (two) times daily.    Marland Kitchen levothyroxine (SYNTHROID, LEVOTHROID) 75 MCG tablet Take 75 mcg by mouth daily.  12   . Omega-3 Fatty Acids (FISH OIL) 1000 MG CAPS Take 2 capsules (2,000 mg total) by mouth 2 (two) times daily.  0  . zolpidem (AMBIEN) 5 MG tablet Take 5 mg by mouth at bedtime as needed for sleep.    Marland Kitchen aspirin (ASPIRIN CHILDRENS) 81 MG chewable tablet Chew 81 mg by mouth daily.     Marland Kitchen VIAGRA 100 MG tablet Take 100 mg by mouth daily as needed for erectile dysfunction.   6   No current facility-administered medications for this visit.    Allergies:   Review of patient's allergies indicates no known allergies.    Social History:  The patient  reports that he has been smoking.  He does not have any smokeless tobacco history on file. He reports that he does not drink alcohol or use illicit drugs.   Family History:  The patient's family history includes Heart attack in his father and paternal uncle; Heart disease in his father and paternal uncle.    ROS:  Please see the history of present illness.   Otherwise, review of systems are positive for mild weight gain, episode of diverticulitis, no bleeding problems.   All other systems are reviewed and negative.  PHYSICAL EXAM: VS:  BP 142/90 mmHg  Pulse 64  Ht 5\' 10"  (1.778 m)  Wt 196 lb (88.905 kg)  BMI 28.12 kg/m2 , BMI Body mass index is 28.12 kg/(m^2). GEN: Well nourished, well developed, in no acute distress HEENT: normal Neck: no JVD, carotid bruits, or masses Cardiac: RRR; no murmurs, rubs, or gallops,no edema  Respiratory:  clear to auscultation bilaterally, normal work of breathing GI: soft, nontender, nondistended, + BS MS: no deformity or atrophy Skin: warm and dry, no rash Neuro:  Strength and sensation are intact Psych: euthymic mood, full affect   EKG:   The ekg ordered today demonstrates normal   Recent Labs: No results found for requested labs within last 365 days.   Lipid Panel No results found for: CHOL, TRIG, HDL, CHOLHDL, VLDL, LDLCALC, LDLDIRECT   Other studies Reviewed: Additional studies/ records that  were reviewed today with results demonstrating: prior stress test.   ASSESSMENT AND PLAN:                     1. Prior chest pain: Atypical chest pain resolved. RF for CAD. Negative ETT in 2014, 10 minutes on treadmill.Marland Kitchen. He will let us know if he has any further symptoms. No need for ischemic testing at this time. 2. Hypercholesterolemia: Continue atorvastatin and fish oil.  Followed by Dr. Kevan NyGates. 3. Family h/o CAD:  Significant RF for CAD. Father was a smoker as well. Patient is smoking. I encouraged him to abstain. We spoke about tools to help him quit stop smoking.    Current medicines are reviewed at length with the patient today.  The patient concerns regarding his medicines were addressed.  The following changes have been made:  No change  Labs/ tests ordered today include:  No orders of the defined types were placed in this encounter.    Recommend 150 minutes/week of aerobic exercise Low fat, low carb, high fiber diet recommended  Disposition:   FU in 1 year   Signed, Lance MussJayadeep Varanasi, MD  02/07/2016 8:15 AM    [facility] [address], [place], [place]  [address] Phone: [phone]([phone]; Fax: [phone]

## 2016-02-07 NOTE — Patient Instructions (Signed)

## 2016-05-16 DIAGNOSIS — E279 Disorder of adrenal gland, unspecified: Secondary | ICD-10-CM | POA: Diagnosis not present

## 2016-05-16 DIAGNOSIS — E039 Hypothyroidism, unspecified: Secondary | ICD-10-CM | POA: Diagnosis not present

## 2016-05-17 DIAGNOSIS — L255 Unspecified contact dermatitis due to plants, except food: Secondary | ICD-10-CM | POA: Diagnosis not present

## 2016-05-18 DIAGNOSIS — L300 Nummular dermatitis: Secondary | ICD-10-CM | POA: Diagnosis not present

## 2016-06-26 DIAGNOSIS — Z125 Encounter for screening for malignant neoplasm of prostate: Secondary | ICD-10-CM | POA: Diagnosis not present

## 2016-06-26 DIAGNOSIS — E559 Vitamin D deficiency, unspecified: Secondary | ICD-10-CM | POA: Diagnosis not present

## 2016-06-26 DIAGNOSIS — E039 Hypothyroidism, unspecified: Secondary | ICD-10-CM | POA: Diagnosis not present

## 2016-06-26 DIAGNOSIS — F329 Major depressive disorder, single episode, unspecified: Secondary | ICD-10-CM | POA: Diagnosis not present

## 2016-06-26 DIAGNOSIS — Z79899 Other long term (current) drug therapy: Secondary | ICD-10-CM | POA: Diagnosis not present

## 2016-06-26 DIAGNOSIS — Z0000 Encounter for general adult medical examination without abnormal findings: Secondary | ICD-10-CM | POA: Diagnosis not present

## 2016-06-26 DIAGNOSIS — F418 Other specified anxiety disorders: Secondary | ICD-10-CM | POA: Diagnosis not present

## 2016-06-26 DIAGNOSIS — E7800 Pure hypercholesterolemia, unspecified: Secondary | ICD-10-CM | POA: Diagnosis not present

## 2016-06-26 DIAGNOSIS — F17291 Nicotine dependence, other tobacco product, in remission: Secondary | ICD-10-CM | POA: Diagnosis not present

## 2016-07-04 DIAGNOSIS — Z23 Encounter for immunization: Secondary | ICD-10-CM | POA: Diagnosis not present

## 2016-08-14 DIAGNOSIS — E039 Hypothyroidism, unspecified: Secondary | ICD-10-CM | POA: Diagnosis not present

## 2016-09-19 DIAGNOSIS — J209 Acute bronchitis, unspecified: Secondary | ICD-10-CM | POA: Diagnosis not present

## 2016-09-22 DIAGNOSIS — K625 Hemorrhage of anus and rectum: Secondary | ICD-10-CM | POA: Diagnosis not present

## 2017-01-04 DIAGNOSIS — L820 Inflamed seborrheic keratosis: Secondary | ICD-10-CM | POA: Diagnosis not present

## 2017-01-04 DIAGNOSIS — L570 Actinic keratosis: Secondary | ICD-10-CM | POA: Diagnosis not present

## 2017-01-04 DIAGNOSIS — L814 Other melanin hyperpigmentation: Secondary | ICD-10-CM | POA: Diagnosis not present

## 2017-01-23 ENCOUNTER — Encounter: Payer: Self-pay | Admitting: Interventional Cardiology

## 2017-02-08 ENCOUNTER — Encounter: Payer: Self-pay | Admitting: Interventional Cardiology

## 2017-02-08 ENCOUNTER — Ambulatory Visit (INDEPENDENT_AMBULATORY_CARE_PROVIDER_SITE_OTHER): Payer: BLUE CROSS/BLUE SHIELD | Admitting: Interventional Cardiology

## 2017-02-08 VITALS — BP 136/76 | HR 77 | Ht 70.0 in | Wt 193.8 lb

## 2017-02-08 DIAGNOSIS — Z8249 Family history of ischemic heart disease and other diseases of the circulatory system: Secondary | ICD-10-CM

## 2017-02-08 DIAGNOSIS — E782 Mixed hyperlipidemia: Secondary | ICD-10-CM

## 2017-02-08 DIAGNOSIS — F17200 Nicotine dependence, unspecified, uncomplicated: Secondary | ICD-10-CM

## 2017-02-08 MED ORDER — ATORVASTATIN CALCIUM 10 MG PO TABS: 10.0000 mg | ORAL_TABLET | Freq: Every day | ORAL | 3 refills | Status: DC

## 2017-02-08 NOTE — Progress Notes (Signed)
Patient ID: Ernest BrillRonald S Costabile, male   DOB: 09/30/1960, 56 y.o.   MRN: [id number]     Cardiology Office Note   Date:  02/08/2017   ID:  Ernest BrillRonald S Joye, DOB 09/26/1960, MRN [id number]  PCP:  Marden NobleGates, Robert, MD    No chief complaint on file. f/u RF for CAD   Wt Readings from Last 3 Encounters:  02/08/17 193 lb 12 oz (87.9 kg)  02/07/16 196 lb (88.9 kg)  03/08/15 185 lb (83.9 kg)       History of Present Illness: Ernest [name] is a 56 y.o. male  who has had a strong family h/o CAD. His father died of an MI at age 57. His father was a heavy smoker. In 2014, he had some left sided chest discomfort that would come and go quickly. It was on and off for over a day.   He had an ETT and went 10 minutes without sx or ECG changes.   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  No other exercise.  He does  A lot of yard work.    No problems with this activity. His job is his most stressful activity physically.  He is keeping up the same exercise tolerance at work.  No chest pain, SHOB, palpitations, dizziness, syncope.  BP is usually higher in the MDs office.  At home, he gets readings in the 115 range.  No change in his family history.  No new episodes of heart disease.      Past Medical History:  Diagnosis Date  . History of colonoscopy 08/20/13  . Hypertension   . Mixed hyperlipidemia 01/21/2014    Past Surgical History:  Procedure Laterality Date  . COLONOSCOPY W/ POLYPECTOMY    . COLONOSCOPY WITH PROPOFOL N/A 03/08/2015   Procedure: COLONOSCOPY WITH PROPOFOL;  Surgeon: Charolett BumpersMartin K Johnson, MD;  Location: [facility];  Service: Endoscopy;  Laterality: N/A;  . SHOULDER ARTHROSCOPY Right   . tonselec     tonsillectomy  . TONSILLECTOMY       Current Outpatient Prescriptions  Medication Sig Dispense Refill  . aspirin (ASPIRIN CHILDRENS) 81 MG chewable tablet Chew 81 mg by mouth daily.     Marland Kitchen. atorvastatin (LIPITOR) 10 MG tablet Take 10 mg by mouth daily.    Marland Kitchen.  buPROPion (WELLBUTRIN SR) 150 MG 12 hr tablet Take 150 mg by mouth 2 (two) times daily.    Marland Kitchen. levothyroxine (SYNTHROID, LEVOTHROID) 75 MCG tablet Take 75 mcg by mouth daily.  12  . Omega-3 Fatty Acids (FISH OIL) 1000 MG CAPS Take 2 capsules (2,000 mg total) by mouth 2 (two) times daily.  0  . VIAGRA 100 MG tablet Take 100 mg by mouth daily as needed for erectile dysfunction.   6  . zolpidem (AMBIEN) 5 MG tablet Take 5 mg by mouth at bedtime as needed for sleep.     No current facility-administered medications for this visit.     Allergies:   Patient has no known allergies.    Social History:  The patient  reports that he has been smoking.  He has a 30.00 pack-year smoking history. He has never used smokeless tobacco. He reports that he does not drink alcohol or use drugs.   Family History:  The patient's family history includes Heart attack in his father and paternal uncle; Heart disease in his father and paternal uncle.    ROS:  Please see the history of present illness.   Otherwise, review  of systems are positive for mild weight gain, episode of diverticulitis, no bleeding problems.   All other systems are reviewed and negative.    PHYSICAL EXAM: VS:  BP 136/76   Pulse 77   Ht 5\' 10"  (1.778 m)   Wt 193 lb 12 oz (87.9 kg)   SpO2 96%   BMI 27.80 kg/m  , BMI Body mass index is 27.8 kg/m. GEN: Well nourished, well developed, in no acute distress  HEENT: normal  Neck: no JVD, carotid bruits, or masses Cardiac: RRR; no murmurs, rubs, or gallops,no edema  Respiratory:  clear to auscultation bilaterally, normal work of breathing GI: soft, nontender, nondistended, + BS MS: no deformity or atrophy  Skin: warm and dry, no rash Neuro:  Strength and sensation are intact Psych: euthymic mood, full affect   EKG:   The ekg ordered today demonstrates normal sinus rhythm, no ST changes   Recent Labs: No results found for requested labs within last 8760 hours.   Lipid Panel No results  found for: CHOL, TRIG, HDL, CHOLHDL, VLDL, LDLCALC, LDLDIRECT   Other studies Reviewed: Additional studies/ records that were reviewed today with results demonstrating: prior stress test.   ASSESSMENT AND PLAN:                     1. Prior chest pain: Atypical chest pain resolved. RF for CAD. Negative ETT in 2014, 10 minutes on treadmill.Marland Kitchen He will let us know if he has any further symptoms. Job has changed.  Strong family history as well.  Will plan for ETT since it has been 4 years. 2. Hypercholesterolemia: Continue atorvastatin and fish oil.  Checked by Dr. Kevan Ny. 3. Family h/o CAD:  Significant RF for CAD. Father was a smoker as well. Patient is smoking and needs to stop. I have encouraged him to abstain.   Current medicines are reviewed at length with the patient today.  The patient concerns regarding his medicines were addressed.  The following changes have been made:  No change  Labs/ tests ordered today include:  No orders of the defined types were placed in this encounter.   Recommend 150 minutes/week of aerobic exercise Low fat, low carb, high fiber diet recommended  Disposition:   FU in 1 year   Signed, Lance Muss, MD  02/08/2017 4:42 PM    [facility] [address] [place], [place], [place]  [address] Phone: [phone]; Fax: [phone]

## 2017-02-08 NOTE — Patient Instructions (Addendum)
Medication Instructions:  Your physician recommends that you continue on your current medications as directed. Please refer to the Current Medication list given to you today.   Labwork: None ordered   Testing/Procedures: Your physician has requested that you have an exercise tolerance test. For further information please visit [email or web address] Please also follow instruction sheet, as given.    Follow-Up: 1 year with Dr. Eldridge Dace.  Any Other Special Instructions Will Be Listed Below (If Applicable).    Exercise Stress Electrocardiogram An exercise stress electrocardiogram is a test that is done to evaluate the blood supply to your heart. This test may also be called exercise stress electrocardiography. The test is done while you are walking on a treadmill. The goal of this test is to raise your heart rate. This test is done to find areas of poor blood flow to the heart by determining the extent of coronary artery disease (CAD). CAD is defined as narrowing in one or more heart (coronary) arteries of more than 70%. If you have an abnormal test result, this may mean that you are not getting adequate blood flow to your heart during exercise. Additional testing may be needed to understand why your test was abnormal. Tell a health care provider about:  Any allergies you have.  All medicines you are taking, including vitamins, herbs, eye drops, creams, and over-the-counter medicines.  Any problems you or family members have had with anesthetic medicines.  Any blood disorders you have.  Any surgeries you have had.  Any medical conditions you have.  Possibility of pregnancy, if this applies. What are the risks? Generally, this is a safe procedure. However, as with any procedure, complications can occur. Possible complications can include:  Pain or pressure in the following areas:  Chest.  Jaw or neck.  Between your shoulder blades.  Radiating down your left  arm.  Dizziness or light-headedness.  Shortness of breath.  Increased or irregular heartbeats.  Nausea or vomiting.  Heart attack (rare). What happens before the procedure?  Avoid all forms of caffeine 24 hours before your test or as directed by your health care provider. This includes coffee, tea (even decaffeinated tea), caffeinated sodas, chocolate, cocoa, and certain pain medicines.  Follow your health care provider's instructions regarding eating and drinking before the test.  Take your medicines as directed at regular times with water unless instructed otherwise. Exceptions may include:  If you have diabetes, ask how you are to take your insulin or pills. It is common to adjust insulin dosing the morning of the test.  If you are taking beta-blocker medicines, it is important to talk to your health care provider about these medicines well before the date of your test. Taking beta-blocker medicines may interfere with the test. In some cases, these medicines need to be changed or stopped 24 hours or more before the test.  If you wear a nitroglycerin patch, it may need to be removed prior to the test. Ask your health care provider if the patch should be removed before the test.  If you use an inhaler for any breathing condition, bring it with you to the test.  If you are an outpatient, bring a snack so you can eat right after the stress phase of the test.  Do not smoke for 4 hours prior to the test or as directed by your health care provider.  Do not apply lotions, powders, creams, or oils on your chest prior to the test.  Wear loose-fitting clothes and comfortable shoes  for the test. This test involves walking on a treadmill. What happens during the procedure?  Multiple patches (electrodes) will be put on your chest. If needed, small areas of your chest may have to be shaved to get better contact with the electrodes. Once the electrodes are attached to your body, multiple wires  will be attached to the electrodes and your heart rate will be monitored.  Your heart will be monitored both at rest and while exercising.  You will walk on a treadmill. The treadmill will be started at a slow pace. The treadmill speed and incline will gradually be increased to raise your heart rate. What happens after the procedure?  Your heart rate and blood pressure will be monitored after the test.  You may return to your normal schedule including diet, activities, and medicines, unless your health care provider tells you otherwise. This information is not intended to replace advice given to you by your health care provider. Make sure you discuss any questions you have with your health care provider. Document Released: 09/08/2000 Document Revised: 02/17/2016 Document Reviewed: 05/19/2013 Elsevier Interactive Patient Education  2017 [employer]    If you need a refill on your cardiac medications before your next appointment, please call your pharmacy.

## 2017-03-08 ENCOUNTER — Ambulatory Visit (INDEPENDENT_AMBULATORY_CARE_PROVIDER_SITE_OTHER): Payer: BLUE CROSS/BLUE SHIELD

## 2017-03-08 DIAGNOSIS — Z8249 Family history of ischemic heart disease and other diseases of the circulatory system: Secondary | ICD-10-CM

## 2017-03-08 LAB — EXERCISE TOLERANCE TEST
CHL CUP MPHR: 164 {beats}/min
CHL CUP RESTING HR STRESS: 81 {beats}/min
CHL RATE OF PERCEIVED EXERTION: 16
Estimated workload: 12 METS
Exercise duration (min): 10 min
Exercise duration (sec): 30 s
Peak HR: 142 {beats}/min
Percent HR: 86 %

## 2017-07-09 DIAGNOSIS — F17291 Nicotine dependence, other tobacco product, in remission: Secondary | ICD-10-CM | POA: Diagnosis not present

## 2017-07-09 DIAGNOSIS — E039 Hypothyroidism, unspecified: Secondary | ICD-10-CM | POA: Diagnosis not present

## 2017-07-09 DIAGNOSIS — Z0000 Encounter for general adult medical examination without abnormal findings: Secondary | ICD-10-CM | POA: Diagnosis not present

## 2017-07-09 DIAGNOSIS — E278 Other specified disorders of adrenal gland: Secondary | ICD-10-CM | POA: Diagnosis not present

## 2017-07-09 DIAGNOSIS — E7800 Pure hypercholesterolemia, unspecified: Secondary | ICD-10-CM | POA: Diagnosis not present

## 2017-07-09 DIAGNOSIS — N529 Male erectile dysfunction, unspecified: Secondary | ICD-10-CM | POA: Diagnosis not present

## 2017-07-12 DIAGNOSIS — E7800 Pure hypercholesterolemia, unspecified: Secondary | ICD-10-CM | POA: Diagnosis not present

## 2017-07-12 DIAGNOSIS — R030 Elevated blood-pressure reading, without diagnosis of hypertension: Secondary | ICD-10-CM | POA: Diagnosis not present

## 2017-07-12 DIAGNOSIS — E559 Vitamin D deficiency, unspecified: Secondary | ICD-10-CM | POA: Diagnosis not present

## 2017-07-12 DIAGNOSIS — Z23 Encounter for immunization: Secondary | ICD-10-CM | POA: Diagnosis not present

## 2017-07-12 DIAGNOSIS — E039 Hypothyroidism, unspecified: Secondary | ICD-10-CM | POA: Diagnosis not present

## 2017-07-12 DIAGNOSIS — Z0000 Encounter for general adult medical examination without abnormal findings: Secondary | ICD-10-CM | POA: Diagnosis not present

## 2017-09-06 DIAGNOSIS — L570 Actinic keratosis: Secondary | ICD-10-CM | POA: Diagnosis not present

## 2017-09-06 DIAGNOSIS — D225 Melanocytic nevi of trunk: Secondary | ICD-10-CM | POA: Diagnosis not present

## 2017-09-06 DIAGNOSIS — L821 Other seborrheic keratosis: Secondary | ICD-10-CM | POA: Diagnosis not present

## 2017-09-06 DIAGNOSIS — L814 Other melanin hyperpigmentation: Secondary | ICD-10-CM | POA: Diagnosis not present

## 2017-09-28 DIAGNOSIS — J189 Pneumonia, unspecified organism: Secondary | ICD-10-CM | POA: Diagnosis not present

## 2017-09-28 DIAGNOSIS — J069 Acute upper respiratory infection, unspecified: Secondary | ICD-10-CM | POA: Diagnosis not present

## 2017-10-09 DIAGNOSIS — E039 Hypothyroidism, unspecified: Secondary | ICD-10-CM | POA: Diagnosis not present

## 2017-10-09 DIAGNOSIS — E7800 Pure hypercholesterolemia, unspecified: Secondary | ICD-10-CM | POA: Diagnosis not present

## 2017-10-09 DIAGNOSIS — E781 Pure hyperglyceridemia: Secondary | ICD-10-CM | POA: Diagnosis not present

## 2017-10-09 DIAGNOSIS — I10 Essential (primary) hypertension: Secondary | ICD-10-CM | POA: Diagnosis not present

## 2017-12-10 DIAGNOSIS — E039 Hypothyroidism, unspecified: Secondary | ICD-10-CM | POA: Diagnosis not present

## 2017-12-10 DIAGNOSIS — E781 Pure hyperglyceridemia: Secondary | ICD-10-CM | POA: Diagnosis not present

## 2017-12-10 DIAGNOSIS — E7800 Pure hypercholesterolemia, unspecified: Secondary | ICD-10-CM | POA: Diagnosis not present

## 2017-12-10 DIAGNOSIS — I10 Essential (primary) hypertension: Secondary | ICD-10-CM | POA: Diagnosis not present

## 2018-01-08 DIAGNOSIS — D225 Melanocytic nevi of trunk: Secondary | ICD-10-CM | POA: Diagnosis not present

## 2018-01-08 DIAGNOSIS — L814 Other melanin hyperpigmentation: Secondary | ICD-10-CM | POA: Diagnosis not present

## 2018-01-08 DIAGNOSIS — L821 Other seborrheic keratosis: Secondary | ICD-10-CM | POA: Diagnosis not present

## 2018-03-05 ENCOUNTER — Ambulatory Visit: Payer: BLUE CROSS/BLUE SHIELD | Admitting: Interventional Cardiology

## 2018-03-15 ENCOUNTER — Ambulatory Visit: Payer: BLUE CROSS/BLUE SHIELD | Admitting: Interventional Cardiology

## 2018-03-15 ENCOUNTER — Encounter: Payer: Self-pay | Admitting: Interventional Cardiology

## 2018-03-15 ENCOUNTER — Encounter (INDEPENDENT_AMBULATORY_CARE_PROVIDER_SITE_OTHER): Payer: Self-pay

## 2018-03-15 VITALS — BP 138/86 | HR 70 | Ht 70.0 in | Wt 192.0 lb

## 2018-03-15 DIAGNOSIS — E782 Mixed hyperlipidemia: Secondary | ICD-10-CM

## 2018-03-15 DIAGNOSIS — Z8249 Family history of ischemic heart disease and other diseases of the circulatory system: Secondary | ICD-10-CM | POA: Diagnosis not present

## 2018-03-15 DIAGNOSIS — E039 Hypothyroidism, unspecified: Secondary | ICD-10-CM

## 2018-03-15 DIAGNOSIS — E7800 Pure hypercholesterolemia, unspecified: Secondary | ICD-10-CM | POA: Diagnosis not present

## 2018-03-15 MED ORDER — ATORVASTATIN CALCIUM 10 MG PO TABS: 10.0000 mg | ORAL_TABLET | Freq: Every day | ORAL | 3 refills | Status: DC

## 2018-03-15 NOTE — Progress Notes (Signed)
Cardiology Office Note   Date:  03/19/2018   ID:  Ernest [name], DOB 04/28/1961, MRN [id number]  PCP:  Marden NobleGates, Robert, MD    No chief complaint on file.  FH of CAD  Wt Readings from Last 3 Encounters:  03/15/18 192 lb (87.1 kg)  02/08/17 193 lb 12 oz (87.9 kg)  02/07/16 196 lb (88.9 kg)       History of Present Illness: Ernest [name] is a 57 y.o. male   who has had a strong family h/o CAD. His father died of an MI at age 58. His father was a heavy smoker. In 2014, he had some left sided chest discomfort that would come and go quickly. It was on and off for over a day.   He had an ETT and went 10 minutes without sx or ECG changes.   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  No other exercise.  He does  A lot of yard work still.  He gets over 10K steps as noted by his phone. He quit smoking in 7/18.  Normal ETT in 6/18.  No change in family history.  In 3/19, LDL was 127 so atorvastatin was increased to 20 mg daily.  No side effects on the medicine.  Denies : Chest pain. Dizziness. Leg edema. Nitroglycerin use. Orthopnea. Palpitations. Paroxysmal nocturnal dyspnea. Shortness of breath. Syncope.   Diet has been healthy.  He tries to eat salads as well.      Past Medical History:  Diagnosis Date  . History of colonoscopy 08/20/13  . Hypertension   . Mixed hyperlipidemia 01/21/2014    Past Surgical History:  Procedure Laterality Date  . COLONOSCOPY W/ POLYPECTOMY    . COLONOSCOPY WITH PROPOFOL N/A 03/08/2015   Procedure: COLONOSCOPY WITH PROPOFOL;  Surgeon: Charolett BumpersMartin K Johnson, MD;  Location: [facility];  Service: Endoscopy;  Laterality: N/A;  . SHOULDER ARTHROSCOPY Right   . tonselec     tonsillectomy  . TONSILLECTOMY       Current Outpatient Medications  Medication Sig Dispense Refill  . ALPRAZolam (XANAX) 0.25 MG tablet Take 1 tablet by mouth as needed for anxiety.  1  . aspirin (ASPIRIN CHILDRENS) 81 MG chewable tablet Chew 81 mg by mouth daily.      Marland Kitchen. atorvastatin (LIPITOR) 10 MG tablet Take 1 tablet (10 mg total) by mouth daily. 90 tablet 3  . buPROPion (WELLBUTRIN SR) 150 MG 12 hr tablet Take 150 mg by mouth 2 (two) times daily.    Marland Kitchen. levothyroxine (SYNTHROID, LEVOTHROID) 75 MCG tablet Take 75 mcg by mouth daily.  12  . Omega-3 Fatty Acids (FISH OIL) 1000 MG CAPS Take 2 capsules (2,000 mg total) by mouth 2 (two) times daily.  0  . VIAGRA 100 MG tablet Take 100 mg by mouth daily as needed for erectile dysfunction.   6  . zolpidem (AMBIEN) 5 MG tablet Take 5 mg by mouth at bedtime as needed for sleep.     No current facility-administered medications for this visit.     Allergies:   Patient has no known allergies.    Social History:  The patient  reports that he has quit smoking. He has a 30.00 pack-year smoking history. He has never used smokeless tobacco. He reports that he does not drink alcohol or use drugs.   Family History:  The patient's family history includes Heart attack in his father and paternal uncle; Heart disease in his father and paternal uncle.  ROS:  Please see the history of present illness.   Otherwise, review of systems are positive for some difficulty stopping smoking.   All other systems are reviewed and negative.    PHYSICAL EXAM: VS:  BP 138/86   Pulse 70   Ht 5\' 10"  (1.778 m)   Wt 192 lb (87.1 kg)   SpO2 97%   BMI 27.55 kg/m  , BMI Body mass index is 27.55 kg/m. GEN: Well nourished, well developed, in no acute distress  HEENT: normal  Neck: no JVD, carotid bruits, or masses Cardiac: RRR; no murmurs, rubs, or gallops,no edema  Respiratory:  clear to auscultation bilaterally, normal work of breathing GI: soft, nontender, nondistended, + BS MS: no deformity or atrophy  Skin: warm and dry, no rash Neuro:  Strength and sensation are intact Psych: euthymic mood, full affect   EKG:   The ekg ordered today demonstrates NSR, no ST changes   Recent Labs: No results found for requested labs within  last 8760 hours.   Lipid Panel No results found for: CHOL, TRIG, HDL, CHOLHDL, VLDL, LDLCALC, LDLDIRECT   Other studies Reviewed: Additional studies/ records that were reviewed today with results demonstrating: 2018 stress test reviewed.   ASSESSMENT AND PLAN:  1. Family history of coronary artery disease: Continue aggressive preventive therapy.  He eats a healthy diet.  He remains active.  He quit smoking in July 2018. 2. Hyperlipidemia: Atorvastatin was recently increased.  Agree with trying to get LDL around 100 given family history. 3.   Hypothyroid: Continue Synthroid.  Managed by primary care.    Current medicines are reviewed at length with the patient today.  The patient concerns regarding his medicines were addressed.  The following changes have been made:  No change  Labs/ tests ordered today include:   Orders Placed This Encounter  Procedures  . EKG 12-Lead    Recommend 150 minutes/week of aerobic exercise Low fat, low carb, high fiber diet recommended  Disposition:   FU in 1 year   Signed, Lance Muss, MD  03/19/2018 11:30 AM    [facility] [address] [place], [place], [place]  [address] Phone: [phone]; Fax: [phone]

## 2018-03-15 NOTE — Patient Instructions (Signed)

## 2018-03-19 ENCOUNTER — Encounter: Payer: Self-pay | Admitting: Interventional Cardiology

## 2018-06-20 DIAGNOSIS — Z23 Encounter for immunization: Secondary | ICD-10-CM | POA: Diagnosis not present

## 2018-07-23 DIAGNOSIS — E039 Hypothyroidism, unspecified: Secondary | ICD-10-CM | POA: Diagnosis not present

## 2018-07-23 DIAGNOSIS — Z0000 Encounter for general adult medical examination without abnormal findings: Secondary | ICD-10-CM | POA: Diagnosis not present

## 2018-07-23 DIAGNOSIS — I10 Essential (primary) hypertension: Secondary | ICD-10-CM | POA: Diagnosis not present

## 2018-07-23 DIAGNOSIS — N529 Male erectile dysfunction, unspecified: Secondary | ICD-10-CM | POA: Diagnosis not present

## 2018-07-23 DIAGNOSIS — E559 Vitamin D deficiency, unspecified: Secondary | ICD-10-CM | POA: Diagnosis not present

## 2018-07-23 DIAGNOSIS — E7800 Pure hypercholesterolemia, unspecified: Secondary | ICD-10-CM | POA: Diagnosis not present

## 2018-07-23 DIAGNOSIS — J449 Chronic obstructive pulmonary disease, unspecified: Secondary | ICD-10-CM | POA: Diagnosis not present

## 2018-10-16 DIAGNOSIS — J209 Acute bronchitis, unspecified: Secondary | ICD-10-CM | POA: Diagnosis not present

## 2018-10-16 DIAGNOSIS — J019 Acute sinusitis, unspecified: Secondary | ICD-10-CM | POA: Diagnosis not present

## 2018-11-29 DIAGNOSIS — L299 Pruritus, unspecified: Secondary | ICD-10-CM | POA: Diagnosis not present

## 2018-11-29 DIAGNOSIS — L300 Nummular dermatitis: Secondary | ICD-10-CM | POA: Diagnosis not present

## 2019-04-20 ENCOUNTER — Other Ambulatory Visit: Payer: Self-pay | Admitting: Interventional Cardiology

## 2019-04-21 ENCOUNTER — Other Ambulatory Visit: Payer: Self-pay | Admitting: Interventional Cardiology

## 2019-04-21 DIAGNOSIS — H1851 Endothelial corneal dystrophy: Secondary | ICD-10-CM | POA: Diagnosis not present

## 2019-04-21 DIAGNOSIS — H5203 Hypermetropia, bilateral: Secondary | ICD-10-CM | POA: Diagnosis not present

## 2019-05-19 DIAGNOSIS — I10 Essential (primary) hypertension: Secondary | ICD-10-CM | POA: Diagnosis not present

## 2019-05-19 DIAGNOSIS — E279 Disorder of adrenal gland, unspecified: Secondary | ICD-10-CM | POA: Diagnosis not present

## 2019-05-19 DIAGNOSIS — E039 Hypothyroidism, unspecified: Secondary | ICD-10-CM | POA: Diagnosis not present

## 2019-05-27 ENCOUNTER — Ambulatory Visit: Payer: BLUE CROSS/BLUE SHIELD | Admitting: Interventional Cardiology

## 2019-06-08 NOTE — Progress Notes (Addendum)
Cardiology Office Note   Date:  06/09/2019   ID:  Ernest [name], DOB December 17, 1960, MRN [id number]  PCP:  Josetta Huddle, MD    No chief complaint on file.  FH CAD  Wt Readings from Last 3 Encounters:  06/09/19 188 lb (85.3 kg)  03/15/18 192 lb (87.1 kg)  02/08/17 193 lb 12 oz (87.9 kg)       History of Present Illness: Ernest [name] is a 58 y.o. male  who has had a strong family h/o CAD. His father died of an MI at age 41. His father was a heavy smoker. In 2014, he had some left sided chest discomfort that would come and go quickly. It was on and off for over a day.He had an ETT and went 10 minutes without sx or ECG changes.   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 No other exercise. He does A lot of yard work still.  He gets over 10K steps as noted by his phone. He quit smoking in 7/18.  Normal ETT in 6/18.  No change in family history.  In 3/19, LDL was 127 so atorvastatin was increased to 20 mg daily.  No side effects on the medicine.  Since the last visit, he had an episode where he started coughing and then passed out.  He was playing Scrabble.  He was not eating or drinking anything.  He has an occasional smokers cough.  He has no recollection.  He woke up with people screaming at him.  He was told that his arms were trembling a little.  Total episode was 20-30 seconds.  He felt ok after he woke up.   Recently, he Denies : Chest pain. Dizziness. Leg edema. Nitroglycerin use. Orthopnea. Palpitations. Paroxysmal nocturnal dyspnea. Shortness of breath.    Since the sycpoal episode, he has returned to activity.  He fished all day yesterday without problems.      Past Medical History:  Diagnosis Date   History of colonoscopy 08/20/13   Hypertension    Mixed hyperlipidemia 01/21/2014    Past Surgical History:  Procedure Laterality Date   COLONOSCOPY W/ POLYPECTOMY     COLONOSCOPY WITH PROPOFOL N/A 03/08/2015   Procedure:  COLONOSCOPY WITH PROPOFOL;  Surgeon: Garlan Fair, MD;  Location: [facility];  Service: Endoscopy;  Laterality: N/A;   SHOULDER ARTHROSCOPY Right    tonselec     tonsillectomy   TONSILLECTOMY       Current Outpatient Medications  Medication Sig Dispense Refill   ALPRAZolam (XANAX) 0.25 MG tablet Take 1 tablet by mouth as needed for anxiety.  1   amLODipine-olmesartan (AZOR) 5-20 MG tablet Take 1 tablet by mouth daily.     aspirin (ASPIRIN CHILDRENS) 81 MG chewable tablet Chew 81 mg by mouth daily.      atorvastatin (LIPITOR) 10 MG tablet TAKE 1 TABLET BY MOUTH DAILY 90 tablet 0   buPROPion (WELLBUTRIN SR) 150 MG 12 hr tablet Take 150 mg by mouth 2 (two) times daily.     hydrochlorothiazide (HYDRODIURIL) 12.5 MG tablet Take 12.5 mg by mouth 2 (two) times daily.     levothyroxine (SYNTHROID, LEVOTHROID) 75 MCG tablet Take 75 mcg by mouth daily.  12   Omega-3 Fatty Acids (FISH OIL) 1000 MG CAPS Take 2 capsules (2,000 mg total) by mouth 2 (two) times daily.  0   VIAGRA 100 MG tablet Take 100 mg by mouth daily as needed for erectile dysfunction.  6   zolpidem (AMBIEN) 5 MG tablet Take 5 mg by mouth at bedtime as needed for sleep.     No current facility-administered medications for this visit.     Allergies:   Patient has no known allergies.    Social History:  The patient  reports that he has quit smoking. He has a 30.00 pack-year smoking history. He has never used smokeless tobacco. He reports that he does not drink alcohol or use drugs.   Family History:  The patient's family history includes Heart attack in his father and paternal uncle; Heart disease in his father and paternal uncle.    ROS:  Please see the history of present illness.   Otherwise, review of systems are positive for syncope.   All other systems are reviewed and negative.    PHYSICAL EXAM: VS:  BP 130/82    Pulse 63    Ht 5\' 10"  (1.778 m)    Wt 188 lb (85.3 kg)    SpO2 98%    BMI 26.98 kg/m  ,  BMI Body mass index is 26.98 kg/m. GEN: Well nourished, well developed, in no acute distress  HEENT: normal  Neck: no JVD, carotid bruits, or masses Cardiac: RRR; no murmurs, rubs, or gallops,no edema  Respiratory:  clear to auscultation bilaterally, normal work of breathing GI: soft, nontender, nondistended, + BS MS: no deformity or atrophy  Skin: warm and dry, no rash Neuro:  Strength and sensation are intact Psych: euthymic mood, full affect   EKG:   The ekg ordered today demonstrates normal ECG   Recent Labs: No results found for requested labs within last 8760 hours.   Lipid Panel No results found for: CHOL, TRIG, HDL, CHOLHDL, VLDL, LDLCALC, LDLDIRECT   Other studies Reviewed: Additional studies/ records that were reviewed today with results demonstrating: stress test.   ASSESSMENT AND PLAN:    1. Family h/o CAD: No ischemia sx.  COntinue preventive therapy.  No angina.  2. Hyperlipidemia: LDL 114. TG 200 in 10/19.  Lipids to be rechecked later today.  Healthy diet.  Continue atorvastatin. 3. Hypothyroid: TSH normal in 10/19.   4. Syncope: Likely cough induced.  Exam normal.  No sign of heart failure.  2018 stress test nornal.  No ischemic sx.  He needs to stop smoking to prevent his cough.  He will let us know if he has further problems.    Current medicines are reviewed at length with the patient today.  The patient concerns regarding his medicines were addressed.  The following changes have been made:  No change  Labs/ tests ordered today include:   Orders Placed This Encounter  Procedures   EKG 12-Lead    Recommend 150 minutes/week of aerobic exercise Low fat, low carb, high fiber diet recommended  Disposition:   FU in 1 year   Signed, Lance MussJayadeep Timea Breed, MD  06/09/2019 8:38 AM    [facility] [address], [place], [place]  [address] Phone: [phone]; Fax: [phone]

## 2019-06-09 ENCOUNTER — Other Ambulatory Visit: Payer: Self-pay

## 2019-06-09 ENCOUNTER — Encounter: Payer: Self-pay | Admitting: Interventional Cardiology

## 2019-06-09 ENCOUNTER — Encounter (INDEPENDENT_AMBULATORY_CARE_PROVIDER_SITE_OTHER): Payer: Self-pay

## 2019-06-09 ENCOUNTER — Ambulatory Visit: Payer: BC Managed Care – PPO | Admitting: Interventional Cardiology

## 2019-06-09 VITALS — BP 130/82 | HR 63 | Ht 70.0 in | Wt 188.0 lb

## 2019-06-09 DIAGNOSIS — E039 Hypothyroidism, unspecified: Secondary | ICD-10-CM

## 2019-06-09 DIAGNOSIS — R55 Syncope and collapse: Secondary | ICD-10-CM

## 2019-06-09 DIAGNOSIS — Z8249 Family history of ischemic heart disease and other diseases of the circulatory system: Secondary | ICD-10-CM

## 2019-06-09 DIAGNOSIS — Z720 Tobacco use: Secondary | ICD-10-CM | POA: Diagnosis not present

## 2019-06-09 DIAGNOSIS — E782 Mixed hyperlipidemia: Secondary | ICD-10-CM | POA: Diagnosis not present

## 2019-06-09 NOTE — Patient Instructions (Signed)
Medication Instructions:  Your physician recommends that you continue on your current medications as directed. Please refer to the Current Medication list given to you today.  If you need a refill on your cardiac medications before your next appointment, please call your pharmacy.   Lab work: None ordered  Testing/Procedures: None Ordered  Follow-Up: At [facility], you and your health needs are our priority.  As part of our continuing mission to provide you with exceptional heart care, we have created designated Provider Care Teams.  These Care Teams include your primary Cardiologist (physician) and Advanced Practice Providers (APPs -  Physician Assistants and Nurse Practitioners) who all work together to provide you with the care you need, when you need it. You will need a follow up appointment in 1 years.  Please call our office 2 months in advance to schedule this appointment with Dr. Irish Lack or one of the following Advanced Practice Providers on your designated Care Team:   [place], PA-C Melina Copa, PA-C . Ermalinda Barrios, PA-C

## 2019-06-25 DIAGNOSIS — I10 Essential (primary) hypertension: Secondary | ICD-10-CM | POA: Diagnosis not present

## 2019-06-25 DIAGNOSIS — E279 Disorder of adrenal gland, unspecified: Secondary | ICD-10-CM | POA: Diagnosis not present

## 2019-07-11 ENCOUNTER — Other Ambulatory Visit: Payer: Self-pay | Admitting: Interventional Cardiology

## 2019-08-05 DIAGNOSIS — Z0000 Encounter for general adult medical examination without abnormal findings: Secondary | ICD-10-CM | POA: Diagnosis not present

## 2019-08-05 DIAGNOSIS — E039 Hypothyroidism, unspecified: Secondary | ICD-10-CM | POA: Diagnosis not present

## 2019-08-05 DIAGNOSIS — Z125 Encounter for screening for malignant neoplasm of prostate: Secondary | ICD-10-CM | POA: Diagnosis not present

## 2019-08-05 DIAGNOSIS — I10 Essential (primary) hypertension: Secondary | ICD-10-CM | POA: Diagnosis not present

## 2019-08-05 DIAGNOSIS — R7309 Other abnormal glucose: Secondary | ICD-10-CM | POA: Diagnosis not present

## 2019-08-05 DIAGNOSIS — E7800 Pure hypercholesterolemia, unspecified: Secondary | ICD-10-CM | POA: Diagnosis not present

## 2019-08-05 DIAGNOSIS — F17201 Nicotine dependence, unspecified, in remission: Secondary | ICD-10-CM | POA: Diagnosis not present

## 2019-08-05 DIAGNOSIS — Z23 Encounter for immunization: Secondary | ICD-10-CM | POA: Diagnosis not present

## 2019-08-05 DIAGNOSIS — E559 Vitamin D deficiency, unspecified: Secondary | ICD-10-CM | POA: Diagnosis not present

## 2019-08-05 DIAGNOSIS — J449 Chronic obstructive pulmonary disease, unspecified: Secondary | ICD-10-CM | POA: Diagnosis not present

## 2019-09-30 ENCOUNTER — Other Ambulatory Visit: Payer: Self-pay | Admitting: Interventional Cardiology

## 2020-02-25 DIAGNOSIS — H2513 Age-related nuclear cataract, bilateral: Secondary | ICD-10-CM | POA: Diagnosis not present

## 2020-02-25 DIAGNOSIS — H2512 Age-related nuclear cataract, left eye: Secondary | ICD-10-CM | POA: Diagnosis not present

## 2020-03-25 DIAGNOSIS — H52202 Unspecified astigmatism, left eye: Secondary | ICD-10-CM | POA: Diagnosis not present

## 2020-03-25 DIAGNOSIS — H2512 Age-related nuclear cataract, left eye: Secondary | ICD-10-CM | POA: Diagnosis not present

## 2020-03-26 DIAGNOSIS — H25011 Cortical age-related cataract, right eye: Secondary | ICD-10-CM | POA: Diagnosis not present

## 2020-03-26 DIAGNOSIS — H25041 Posterior subcapsular polar age-related cataract, right eye: Secondary | ICD-10-CM | POA: Diagnosis not present

## 2020-03-26 DIAGNOSIS — H2511 Age-related nuclear cataract, right eye: Secondary | ICD-10-CM | POA: Diagnosis not present

## 2020-04-08 DIAGNOSIS — H52201 Unspecified astigmatism, right eye: Secondary | ICD-10-CM | POA: Diagnosis not present

## 2020-04-08 DIAGNOSIS — H2511 Age-related nuclear cataract, right eye: Secondary | ICD-10-CM | POA: Diagnosis not present

## 2020-05-03 DIAGNOSIS — Z1159 Encounter for screening for other viral diseases: Secondary | ICD-10-CM | POA: Diagnosis not present

## 2020-05-08 ENCOUNTER — Other Ambulatory Visit: Payer: Self-pay | Admitting: Physician Assistant

## 2020-05-08 MED ORDER — SODIUM CHLORIDE 0.9 % IV SOLN
1200.0000 mg | Freq: Once | INTRAVENOUS | Status: AC
  Administered 2020-05-09: 1200 mg via INTRAVENOUS
  Filled 2020-05-08: qty 1200

## 2020-05-08 MED ORDER — ONDANSETRON HCL 4 MG PO TABS: 4.0000 mg | ORAL_TABLET | Freq: Three times a day (TID) | ORAL | 0 refills | Status: DC | PRN

## 2020-05-08 NOTE — Progress Notes (Addendum)
Called in zofran for n/v  Cline Crock PA-C  [facility]

## 2020-05-08 NOTE — Progress Notes (Signed)
I connected by phone with Ernest [name] on 05/08/2020 at 10:46 AM to discuss the potential use of a new treatment for mild to moderate COVID-19 viral infection in non-hospitalized patients.  This patient is a 59 y.o. male that meets the FDA criteria for Emergency Use Authorization of COVID monoclonal antibody casirivimab/imdevimab.  Has a (+) direct SARS-CoV-2 viral test result  Has mild or moderate COVID-19   Is NOT hospitalized due to COVID-19  Is within 10 days of symptom onset  Has at least one of the high risk factor(s) for progression to severe COVID-19 and/or hospitalization as defined in EUA.  Specific high risk criteria : BMI > 25, HTN   I have spoken and communicated the following to the patient or parent/caregiver regarding COVID monoclonal antibody treatment:  1. FDA has authorized the emergency use for the treatment of mild to moderate COVID-19 in adults and pediatric patients with positive results of direct SARS-CoV-2 viral testing who are 27 years of age and older weighing at least 40 kg, and who are at high risk for progressing to severe COVID-19 and/or hospitalization.  2. The significant known and potential risks and benefits of COVID monoclonal antibody, and the extent to which such potential risks and benefits are unknown.  3. Information on available alternative treatments and the risks and benefits of those alternatives, including clinical trials.  4. Patients treated with COVID monoclonal antibody should continue to self-isolate and use infection control measures (e.g., wear mask, isolate, social distance, avoid sharing personal items, clean and disinfect "high touch" surfaces, and frequent handwashing) according to CDC guidelines.   5. The patient or parent/caregiver has the option to accept or refuse COVID monoclonal antibody treatment.  After reviewing this information with the patient, The patient agreed to proceed with receiving casirivimab\imdevimab  infusion and will be provided a copy of the Fact sheet prior to receiving the infusion.  Sx onset 8/7. Set up for infusion on 8/15 @ 12:30pm. Directions given to [facility]. Pt is aware that insurance will be charged an infusion fee. Pt is not vaccinated. He will bring in a copy of his + test from [place].   Cline Crock 05/08/2020 10:46 AM

## 2020-05-09 ENCOUNTER — Ambulatory Visit (HOSPITAL_COMMUNITY)
Admission: RE | Admit: 2020-05-09 | Discharge: 2020-05-09 | Disposition: A | Discharge status: Home / Self Care | Payer: BLUE CROSS/BLUE SHIELD | Source: Ambulatory Visit | Attending: Pulmonary Disease | Admitting: Pulmonary Disease

## 2020-05-09 DIAGNOSIS — U071 COVID-19: Secondary | ICD-10-CM | POA: Diagnosis not present

## 2020-05-09 MED ORDER — ALBUTEROL SULFATE HFA 108 (90 BASE) MCG/ACT IN AERS: 2.0000 | INHALATION_SPRAY | Freq: Once | RESPIRATORY_TRACT | Status: DC | PRN

## 2020-05-09 MED ORDER — DIPHENHYDRAMINE HCL 50 MG/ML IJ SOLN: 50.0000 mg | Freq: Once | INTRAMUSCULAR | Status: DC | PRN

## 2020-05-09 MED ORDER — SODIUM CHLORIDE 0.9 % IV SOLN: INTRAVENOUS | Status: DC | PRN

## 2020-05-09 MED ORDER — METHYLPREDNISOLONE SODIUM SUCC 125 MG IJ SOLR: 125.0000 mg | Freq: Once | INTRAMUSCULAR | Status: DC | PRN

## 2020-05-09 MED ORDER — FAMOTIDINE IN NACL 20-0.9 MG/50ML-% IV SOLN: 20.0000 mg | Freq: Once | INTRAVENOUS | Status: DC | PRN

## 2020-05-09 MED ORDER — EPINEPHRINE 0.3 MG/0.3ML IJ SOAJ: 0.3000 mg | Freq: Once | INTRAMUSCULAR | Status: DC | PRN

## 2020-05-09 NOTE — Discharge Instructions (Signed)

## 2020-05-09 NOTE — Progress Notes (Signed)
  Diagnosis: COVID-19  Physician: Wright, MD   Procedure: [facility] Med: casirivimab\imdevimab infusion - Provided patient with casirivimab\imdevimab fact sheet for patients, parents and caregivers prior to infusion.  Complications: No immediate complications noted.  Discharge: Discharged home   Ernest [name] 05/09/2020   

## 2020-06-16 NOTE — Progress Notes (Signed)
Cardiology Office Note   Date:  06/17/2020   ID:  Ernest [name], DOB [birth year], MRN [id number]  PCP:  Marden Noble, MD    No chief complaint on file.  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 male   who has had a strong family h/o CAD. His father died of an MI at age 20. His father was a heavy smoker. In 2014, he had some left sided chest discomfort that would come and go quickly. It was on and off for over a day.He had an ETT and went 10 minutes without sx or ECG changes.   In the past,  walked a lotat workand Pharmacologist. No other exercise. He does A lot of yard workstill. He gets over 10K steps as noted by his phone. He quit smoking in 7/18.  Normal ETT in 6/18.  In 3/19, LDL was 127 so atorvastatin was increased to 20 mg daily. No side effects on the medicine.  In 2020, had cough induced syncope.   He had COVID in 8/21.  He got the antibody treatment and recovered. He now believes in the vaccine.  Denies : Chest pain. Dizziness. Leg edema. Nitroglycerin use. Orthopnea. Palpitations. Paroxysmal nocturnal dyspnea. Shortness of breath. Syncope.   He quit smoking.  Back to work after 3 weeks off for COVID.    Past Medical History:  Diagnosis Date  . History of colonoscopy 08/20/13  . Hypertension   . Mixed hyperlipidemia 01/21/2014    Past Surgical History:  Procedure Laterality Date  . COLONOSCOPY W/ POLYPECTOMY    . COLONOSCOPY WITH PROPOFOL N/A 03/08/2015   Procedure: COLONOSCOPY WITH PROPOFOL;  Surgeon: Charolett Bumpers, MD;  Location: [facility];  Service: Endoscopy;  Laterality: N/A;  . SHOULDER ARTHROSCOPY Right   . tonselec     tonsillectomy  . TONSILLECTOMY       Current Outpatient Medications  Medication Sig Dispense Refill  . ALPRAZolam (XANAX)  0.25 MG tablet Take 1 tablet by mouth as needed for anxiety.  1  . amLODipine-olmesartan (AZOR) 5-20 MG tablet Take 1 tablet by mouth daily.    Marland Kitchen aspirin (ASPIRIN CHILDRENS) 81 MG chewable tablet Chew 81 mg by mouth daily.     Marland Kitchen atorvastatin (LIPITOR) 10 MG tablet TAKE 1 TABLET BY MOUTH DAILY 90 tablet 0  . buPROPion (WELLBUTRIN SR) 150 MG 12 hr tablet Take 150 mg by mouth 2 (two) times daily.    . hydrochlorothiazide (HYDRODIURIL) 12.5 MG tablet Take 12.5 mg by mouth 2 (two) times daily.    . Omega-3 Fatty Acids (FISH OIL) 1000 MG CAPS Take 2 capsules (2,000 mg total) by mouth 2 (two) times daily.  0  . ondansetron (ZOFRAN) 4 MG tablet Take 1 tablet (4 mg total) by mouth every 8 (eight) hours as needed for nausea or vomiting. 20 tablet 0  . polyethylene glycol-electrolytes (NULYTELY) 420 g solution Take 4,000 mLs by mouth as directed.    Marland Kitchen SYNTHROID 100 MCG tablet Take 100 mcg by mouth daily.    Marland Kitchen VIAGRA 100 MG tablet Take 100 mg by mouth daily as needed for erectile dysfunction.   6  . zolpidem (AMBIEN) 5 MG tablet Take 5 mg by mouth at bedtime as needed for sleep.  No current facility-administered medications for this visit.    Allergies:   Patient has no known allergies.    Social History:  The patient  reports that he has quit smoking. He has a 30.00 pack-year smoking history. He has never used smokeless tobacco. He reports that he does not drink alcohol and does not use drugs.   Family History:  The patient's family history includes Heart attack in his father and paternal uncle; Heart disease in his father and paternal uncle.    ROS:  Please see the history of present illness.   Otherwise, review of systems are positive for recent COVID infection.   All other systems are reviewed and negative.    PHYSICAL EXAM: VS:  BP 136/88   Pulse 75   Ht 5\' 10"  (1.778 m)   Wt 184 lb 3.2 oz (83.6 kg)   SpO2 96%   BMI 26.43 kg/m  , BMI Body mass index is 26.43 kg/m. GEN: Well nourished,  well developed, in no acute distress  HEENT: normal  Neck: no JVD, carotid bruits, or masses Cardiac: RRR; no murmurs, rubs, or gallops,no edema  Respiratory:  clear to auscultation bilaterally, normal work of breathing GI: soft, nontender, nondistended, + BS MS: no deformity or atrophy  Skin: warm and dry, no rash Neuro:  Strength and sensation are intact Psych: euthymic mood, full affect   EKG:   The ekg ordered today demonstrates Normal ECG   Recent Labs: No results found for requested labs within last 8760 hours.   Lipid Panel No results found for: CHOL, TRIG, HDL, CHOLHDL, VLDL, LDLCALC, LDLDIRECT   Other studies Reviewed: Additional studies/ records that were reviewed today with results demonstrating: Hospital records reviewed.   ASSESSMENT AND PLAN:  1. FAmily h/o CAD: No angina. Continue aggressive preventive therapy.  We discussed calcium scoring CT.  He will do this in the future, after he has his COVID vaccines.   He understands that it is not covered by insurance. 2. Hyperlipidemia: TG 400 in 07/2019. 3. Hypothyroid: TSH normal in 2020. 4. Prior tobacco abuse:  He continues to abstain.   5. He will take the vaccine after 90 days form the infusion date.  We discussed the vaccine and COVID at length.     Current medicines are reviewed at length with the patient today.  The patient concerns regarding his medicines were addressed.  The following changes have been made:  No change  Labs/ tests ordered today include:  No orders of the defined types were placed in this encounter.   Recommend 150 minutes/week of aerobic exercise Low fat, low carb, high fiber diet recommended  Disposition:   FU in 1 year   Signed, 2021, MD  06/17/2020 4:17 PM    [facility] [address] [place], [place], [place]  [place] Phone: [phone]; Fax: [phone]

## 2020-06-17 ENCOUNTER — Encounter: Payer: Self-pay | Admitting: Interventional Cardiology

## 2020-06-17 ENCOUNTER — Other Ambulatory Visit: Payer: Self-pay

## 2020-06-17 ENCOUNTER — Ambulatory Visit (INDEPENDENT_AMBULATORY_CARE_PROVIDER_SITE_OTHER): Payer: BC Managed Care – PPO | Admitting: Interventional Cardiology

## 2020-06-17 VITALS — BP 136/88 | HR 75 | Ht 70.0 in | Wt 184.2 lb

## 2020-06-17 DIAGNOSIS — Z8249 Family history of ischemic heart disease and other diseases of the circulatory system: Secondary | ICD-10-CM | POA: Diagnosis not present

## 2020-06-17 DIAGNOSIS — E782 Mixed hyperlipidemia: Secondary | ICD-10-CM

## 2020-06-17 DIAGNOSIS — E039 Hypothyroidism, unspecified: Secondary | ICD-10-CM

## 2020-06-17 NOTE — Patient Instructions (Signed)
Medication Instructions:  Your physician recommends that you continue on your current medications as directed. Please refer to the Current Medication list given to you today.  *If you need a refill on your cardiac medications before your next appointment, please call your pharmacy*   Lab Work: None  If you have labs (blood work) drawn today and your tests are completely normal, you will receive your results only by: . MyChart Message (if you have MyChart) OR . A paper copy in the mail If you have any lab test that is abnormal or we need to change your treatment, we will call you to review the results.   Testing/Procedures: None   Follow-Up: At [facility], you and your health needs are our priority.  As part of our continuing mission to provide you with exceptional heart care, we have created designated Provider Care Teams.  These Care Teams include your primary Cardiologist (physician) and Advanced Practice Providers (APPs -  Physician Assistants and Nurse Practitioners) who all work together to provide you with the care you need, when you need it.  We recommend signing up for the patient portal called "MyChart".  Sign up information is provided on this After Visit Summary.  MyChart is used to connect with patients for Virtual Visits (Telemedicine).  Patients are able to view lab/test results, encounter notes, upcoming appointments, etc.  Non-urgent messages can be sent to your provider as well.   To learn more about what you can do with MyChart, go to [email or web address].    Your next appointment:   12 month(s)  The format for your next appointment:   In Person  Provider:   You may see Jay Varanasi, MD or one of the following Advanced Practice Providers on your designated Care Team:    Dayna Dunn, PA-C  Michele Lenze, PA-C    Other Instructions None  

## 2020-09-02 DIAGNOSIS — E7800 Pure hypercholesterolemia, unspecified: Secondary | ICD-10-CM | POA: Diagnosis not present

## 2020-09-02 DIAGNOSIS — Z79899 Other long term (current) drug therapy: Secondary | ICD-10-CM | POA: Diagnosis not present

## 2020-09-02 DIAGNOSIS — Z0000 Encounter for general adult medical examination without abnormal findings: Secondary | ICD-10-CM | POA: Diagnosis not present

## 2020-09-02 DIAGNOSIS — N529 Male erectile dysfunction, unspecified: Secondary | ICD-10-CM | POA: Diagnosis not present

## 2020-09-02 DIAGNOSIS — I10 Essential (primary) hypertension: Secondary | ICD-10-CM | POA: Diagnosis not present

## 2020-09-02 DIAGNOSIS — R7309 Other abnormal glucose: Secondary | ICD-10-CM | POA: Diagnosis not present

## 2020-09-02 DIAGNOSIS — J449 Chronic obstructive pulmonary disease, unspecified: Secondary | ICD-10-CM | POA: Diagnosis not present

## 2020-09-02 DIAGNOSIS — E039 Hypothyroidism, unspecified: Secondary | ICD-10-CM | POA: Diagnosis not present

## 2020-09-02 DIAGNOSIS — E559 Vitamin D deficiency, unspecified: Secondary | ICD-10-CM | POA: Diagnosis not present

## 2020-09-02 DIAGNOSIS — Z125 Encounter for screening for malignant neoplasm of prostate: Secondary | ICD-10-CM | POA: Diagnosis not present

## 2020-09-09 DIAGNOSIS — E538 Deficiency of other specified B group vitamins: Secondary | ICD-10-CM | POA: Diagnosis not present

## 2020-09-16 DIAGNOSIS — E538 Deficiency of other specified B group vitamins: Secondary | ICD-10-CM | POA: Diagnosis not present

## 2020-09-23 DIAGNOSIS — E538 Deficiency of other specified B group vitamins: Secondary | ICD-10-CM | POA: Diagnosis not present

## 2020-10-13 DIAGNOSIS — D171 Benign lipomatous neoplasm of skin and subcutaneous tissue of trunk: Secondary | ICD-10-CM | POA: Diagnosis not present

## 2020-10-20 DIAGNOSIS — Z01812 Encounter for preprocedural laboratory examination: Secondary | ICD-10-CM | POA: Diagnosis not present

## 2020-10-25 DIAGNOSIS — Z86010 Personal history of colonic polyps: Secondary | ICD-10-CM | POA: Diagnosis not present

## 2020-10-25 DIAGNOSIS — D122 Benign neoplasm of ascending colon: Secondary | ICD-10-CM | POA: Diagnosis not present

## 2020-11-09 DIAGNOSIS — J324 Chronic pansinusitis: Secondary | ICD-10-CM | POA: Diagnosis not present

## 2021-03-16 DIAGNOSIS — S8391XA Sprain of unspecified site of right knee, initial encounter: Secondary | ICD-10-CM | POA: Diagnosis not present

## 2021-04-28 DIAGNOSIS — D485 Neoplasm of uncertain behavior of skin: Secondary | ICD-10-CM | POA: Diagnosis not present

## 2021-04-28 DIAGNOSIS — L570 Actinic keratosis: Secondary | ICD-10-CM | POA: Diagnosis not present

## 2021-04-28 DIAGNOSIS — L814 Other melanin hyperpigmentation: Secondary | ICD-10-CM | POA: Diagnosis not present

## 2021-04-28 DIAGNOSIS — L905 Scar conditions and fibrosis of skin: Secondary | ICD-10-CM | POA: Diagnosis not present

## 2021-04-28 DIAGNOSIS — C44321 Squamous cell carcinoma of skin of nose: Secondary | ICD-10-CM | POA: Diagnosis not present

## 2021-04-28 DIAGNOSIS — D2239 Melanocytic nevi of other parts of face: Secondary | ICD-10-CM | POA: Diagnosis not present

## 2021-04-28 DIAGNOSIS — L821 Other seborrheic keratosis: Secondary | ICD-10-CM | POA: Diagnosis not present

## 2021-04-28 DIAGNOSIS — D225 Melanocytic nevi of trunk: Secondary | ICD-10-CM | POA: Diagnosis not present

## 2021-04-29 ENCOUNTER — Telehealth: Payer: Self-pay | Admitting: Interventional Cardiology

## 2021-04-29 NOTE — Telephone Encounter (Signed)
I spoke with patient.  He is not sure what type of test was recommended.  Per last office note in 9/21  FAmily h/o CAD: No angina. Continue aggressive preventive therapy.  We discussed calcium scoring CT.  He will do this in the future, after he has his COVID vaccines.   He understands that it is not covered by insurance  I told patient test was done in our office and cost was $99.  Patient is not having any chest pain.  Feels the same as he did when he saw Dr Eldridge Dace last September.   I told patient I would check with Dr Eldridge Dace and if OK to order we would call him back to schedule

## 2021-04-29 NOTE — Telephone Encounter (Signed)
New message:     Patient call to see if he need a order to for CT patient states the doctor would like for him to get one before his appt. Please advise

## 2021-04-29 NOTE — Telephone Encounter (Signed)
OK to order calcium scoring CT  JV

## 2021-05-31 ENCOUNTER — Ambulatory Visit (INDEPENDENT_AMBULATORY_CARE_PROVIDER_SITE_OTHER)
Admission: RE | Admit: 2021-05-31 | Discharge: 2021-05-31 | Disposition: A | Discharge status: Home / Self Care | Payer: Self-pay | Source: Ambulatory Visit | Attending: Interventional Cardiology | Admitting: Interventional Cardiology

## 2021-05-31 ENCOUNTER — Other Ambulatory Visit: Payer: Self-pay

## 2021-05-31 DIAGNOSIS — Z8249 Family history of ischemic heart disease and other diseases of the circulatory system: Secondary | ICD-10-CM

## 2021-06-03 ENCOUNTER — Telehealth: Payer: Self-pay | Admitting: *Deleted

## 2021-06-03 DIAGNOSIS — E782 Mixed hyperlipidemia: Secondary | ICD-10-CM

## 2021-06-03 MED ORDER — ATORVASTATIN CALCIUM 40 MG PO TABS
40.0000 mg | ORAL_TABLET | Freq: Every day | ORAL | 3 refills | Status: DC
Start: 2021-06-03 — End: 2021-09-28

## 2021-06-03 NOTE — Telephone Encounter (Signed)
Patient notified.  Prescription sent to [facility] in [place].  He will come in for fasting lab work on December 9,2022.

## 2021-06-03 NOTE — Telephone Encounter (Signed)
-----   Message from Corky Crafts, MD sent at 06/02/2021 10:58 PM EDT ----- Calcium score above 100, 84th percentile.  WOuld increase atorvastatin to 40 mg and check liver and lipids in 3 months. PLant based diet, regular exercise.

## 2021-06-21 DIAGNOSIS — D0439 Carcinoma in situ of skin of other parts of face: Secondary | ICD-10-CM | POA: Diagnosis not present

## 2021-06-24 DIAGNOSIS — M25561 Pain in right knee: Secondary | ICD-10-CM | POA: Diagnosis not present

## 2021-06-30 DIAGNOSIS — S83281A Other tear of lateral meniscus, current injury, right knee, initial encounter: Secondary | ICD-10-CM | POA: Diagnosis not present

## 2021-06-30 DIAGNOSIS — M1711 Unilateral primary osteoarthritis, right knee: Secondary | ICD-10-CM | POA: Diagnosis not present

## 2021-06-30 DIAGNOSIS — M25561 Pain in right knee: Secondary | ICD-10-CM | POA: Diagnosis not present

## 2021-07-14 DIAGNOSIS — M25561 Pain in right knee: Secondary | ICD-10-CM | POA: Diagnosis not present

## 2021-07-14 DIAGNOSIS — M1711 Unilateral primary osteoarthritis, right knee: Secondary | ICD-10-CM | POA: Diagnosis not present

## 2021-07-15 ENCOUNTER — Encounter (HOSPITAL_BASED_OUTPATIENT_CLINIC_OR_DEPARTMENT_OTHER): Payer: Self-pay | Admitting: Orthopedic Surgery

## 2021-07-15 ENCOUNTER — Encounter (HOSPITAL_BASED_OUTPATIENT_CLINIC_OR_DEPARTMENT_OTHER)
Admission: RE | Admit: 2021-07-15 | Discharge: 2021-07-15 | Disposition: A | Discharge status: Home / Self Care | Payer: BC Managed Care – PPO | Source: Ambulatory Visit | Attending: Orthopedic Surgery | Admitting: Orthopedic Surgery

## 2021-07-15 ENCOUNTER — Other Ambulatory Visit: Payer: Self-pay | Admitting: Orthopedic Surgery

## 2021-07-15 ENCOUNTER — Other Ambulatory Visit: Payer: Self-pay

## 2021-07-15 DIAGNOSIS — Z01812 Encounter for preprocedural laboratory examination: Secondary | ICD-10-CM | POA: Insufficient documentation

## 2021-07-15 DIAGNOSIS — S83241A Other tear of medial meniscus, current injury, right knee, initial encounter: Secondary | ICD-10-CM | POA: Diagnosis not present

## 2021-07-15 DIAGNOSIS — M6751 Plica syndrome, right knee: Secondary | ICD-10-CM | POA: Diagnosis not present

## 2021-07-15 DIAGNOSIS — Z8249 Family history of ischemic heart disease and other diseases of the circulatory system: Secondary | ICD-10-CM | POA: Diagnosis not present

## 2021-07-15 DIAGNOSIS — M94261 Chondromalacia, right knee: Secondary | ICD-10-CM | POA: Diagnosis not present

## 2021-07-15 DIAGNOSIS — Z79899 Other long term (current) drug therapy: Secondary | ICD-10-CM | POA: Diagnosis not present

## 2021-07-15 DIAGNOSIS — X58XXXA Exposure to other specified factors, initial encounter: Secondary | ICD-10-CM | POA: Diagnosis not present

## 2021-07-15 DIAGNOSIS — Z87891 Personal history of nicotine dependence: Secondary | ICD-10-CM | POA: Diagnosis not present

## 2021-07-15 LAB — BASIC METABOLIC PANEL
Anion gap: 6 (ref 5–15)
BUN: 15 mg/dL (ref 8–23)
CO2: 29 mmol/L (ref 22–32)
Calcium: 9.3 mg/dL (ref 8.9–10.3)
Chloride: 104 mmol/L (ref 98–111)
Creatinine, Ser: 1.47 mg/dL — ABNORMAL HIGH (ref 0.61–1.24)
GFR, Estimated: 54 mL/min — ABNORMAL LOW (ref >60)
Glucose, Bld: 91 mg/dL (ref 70–99)
Potassium: 4.6 mmol/L (ref 3.5–5.1)
Sodium: 139 mmol/L (ref 135–145)

## 2021-07-18 ENCOUNTER — Encounter (HOSPITAL_BASED_OUTPATIENT_CLINIC_OR_DEPARTMENT_OTHER)
Admission: RE | Disposition: A | Discharge status: Home / Self Care | Payer: Self-pay | Source: Home / Self Care | Attending: Orthopedic Surgery

## 2021-07-18 ENCOUNTER — Encounter (HOSPITAL_BASED_OUTPATIENT_CLINIC_OR_DEPARTMENT_OTHER): Payer: Self-pay | Admitting: Orthopedic Surgery

## 2021-07-18 ENCOUNTER — Other Ambulatory Visit: Payer: Self-pay

## 2021-07-18 ENCOUNTER — Ambulatory Visit (HOSPITAL_BASED_OUTPATIENT_CLINIC_OR_DEPARTMENT_OTHER): Payer: BC Managed Care – PPO | Admitting: Anesthesiology

## 2021-07-18 ENCOUNTER — Ambulatory Visit (HOSPITAL_BASED_OUTPATIENT_CLINIC_OR_DEPARTMENT_OTHER)
Admission: RE | Admit: 2021-07-18 | Discharge: 2021-07-18 | Disposition: A | Discharge status: Home / Self Care | Payer: BC Managed Care – PPO | Attending: Orthopedic Surgery | Admitting: Orthopedic Surgery

## 2021-07-18 DIAGNOSIS — Z87891 Personal history of nicotine dependence: Secondary | ICD-10-CM | POA: Diagnosis not present

## 2021-07-18 DIAGNOSIS — Z01812 Encounter for preprocedural laboratory examination: Secondary | ICD-10-CM | POA: Insufficient documentation

## 2021-07-18 DIAGNOSIS — S83231A Complex tear of medial meniscus, current injury, right knee, initial encounter: Secondary | ICD-10-CM | POA: Diagnosis not present

## 2021-07-18 DIAGNOSIS — Z79899 Other long term (current) drug therapy: Secondary | ICD-10-CM | POA: Insufficient documentation

## 2021-07-18 DIAGNOSIS — Z8249 Family history of ischemic heart disease and other diseases of the circulatory system: Secondary | ICD-10-CM | POA: Diagnosis not present

## 2021-07-18 DIAGNOSIS — X58XXXA Exposure to other specified factors, initial encounter: Secondary | ICD-10-CM | POA: Diagnosis not present

## 2021-07-18 DIAGNOSIS — M6751 Plica syndrome, right knee: Secondary | ICD-10-CM

## 2021-07-18 DIAGNOSIS — M94261 Chondromalacia, right knee: Secondary | ICD-10-CM | POA: Diagnosis present

## 2021-07-18 DIAGNOSIS — S83241A Other tear of medial meniscus, current injury, right knee, initial encounter: Secondary | ICD-10-CM

## 2021-07-18 DIAGNOSIS — I10 Essential (primary) hypertension: Secondary | ICD-10-CM | POA: Diagnosis not present

## 2021-07-18 HISTORY — PX: KNEE ARTHROSCOPY WITH MEDIAL MENISECTOMY: SHX5651

## 2021-07-18 HISTORY — PX: CHONDROPLASTY: SHX5177

## 2021-07-18 SURGERY — ARTHROSCOPY, KNEE, WITH MEDIAL MENISCECTOMY
Anesthesia: General | Site: Knee | Laterality: Right

## 2021-07-18 MED ORDER — HYDROMORPHONE HCL 1 MG/ML IJ SOLN: 0.2500 mg | INTRAMUSCULAR | Status: DC | PRN

## 2021-07-18 MED ORDER — PROMETHAZINE HCL 25 MG/ML IJ SOLN: 6.2500 mg | INTRAMUSCULAR | Status: DC | PRN

## 2021-07-18 MED ORDER — LACTATED RINGERS IV SOLN: INTRAVENOUS | Status: DC

## 2021-07-18 MED ORDER — OXYCODONE HCL 5 MG/5ML PO SOLN
5.0000 mg | Freq: Once | ORAL | Status: DC | PRN
Start: 2021-07-18 — End: 2021-07-18

## 2021-07-18 MED ORDER — SODIUM CHLORIDE 0.9 % IR SOLN
Status: DC | PRN
  Administered 2021-07-18: 6000 mL

## 2021-07-18 MED ORDER — DEXAMETHASONE SODIUM PHOSPHATE 10 MG/ML IJ SOLN
INTRAMUSCULAR | Status: AC
  Filled 2021-07-18: qty 1

## 2021-07-18 MED ORDER — OXYCODONE HCL 5 MG PO TABS: 5.0000 mg | ORAL_TABLET | Freq: Once | ORAL | Status: DC | PRN

## 2021-07-18 MED ORDER — FENTANYL CITRATE (PF) 100 MCG/2ML IJ SOLN
INTRAMUSCULAR | Status: AC
  Filled 2021-07-18: qty 2

## 2021-07-18 MED ORDER — ONDANSETRON HCL 4 MG/2ML IJ SOLN
INTRAMUSCULAR | Status: DC | PRN
  Administered 2021-07-18: 4 mg via INTRAVENOUS

## 2021-07-18 MED ORDER — BUPIVACAINE HCL (PF) 0.25 % IJ SOLN
INTRAMUSCULAR | Status: DC | PRN
  Administered 2021-07-18: 20 mL

## 2021-07-18 MED ORDER — DEXAMETHASONE SODIUM PHOSPHATE 10 MG/ML IJ SOLN
INTRAMUSCULAR | Status: DC | PRN
  Administered 2021-07-18: 10 mg via INTRAVENOUS

## 2021-07-18 MED ORDER — HYDROCODONE-ACETAMINOPHEN 5-325 MG PO TABS: 1.0000 | ORAL_TABLET | Freq: Four times a day (QID) | ORAL | 0 refills | Status: DC | PRN

## 2021-07-18 MED ORDER — MEPERIDINE HCL 25 MG/ML IJ SOLN: 6.2500 mg | INTRAMUSCULAR | Status: DC | PRN

## 2021-07-18 MED ORDER — CEFAZOLIN SODIUM-DEXTROSE 2-4 GM/100ML-% IV SOLN: 2.0000 g | INTRAVENOUS | Status: DC

## 2021-07-18 MED ORDER — FENTANYL CITRATE (PF) 100 MCG/2ML IJ SOLN
INTRAMUSCULAR | Status: DC | PRN
  Administered 2021-07-18 (×4): 25 ug via INTRAVENOUS
  Administered 2021-07-18: 100 ug via INTRAVENOUS

## 2021-07-18 MED ORDER — LIDOCAINE 2% (20 MG/ML) 5 ML SYRINGE
INTRAMUSCULAR | Status: AC
  Filled 2021-07-18: qty 5

## 2021-07-18 MED ORDER — AMISULPRIDE (ANTIEMETIC) 5 MG/2ML IV SOLN: 10.0000 mg | Freq: Once | INTRAVENOUS | Status: DC | PRN

## 2021-07-18 MED ORDER — LACTATED RINGERS IV SOLN: INTRAVENOUS | Status: DC | PRN

## 2021-07-18 MED ORDER — MIDAZOLAM HCL 2 MG/2ML IJ SOLN
INTRAMUSCULAR | Status: DC | PRN
  Administered 2021-07-18: 2 mg via INTRAVENOUS

## 2021-07-18 MED ORDER — PHENYLEPHRINE HCL (PRESSORS) 10 MG/ML IV SOLN
INTRAVENOUS | Status: DC | PRN
  Administered 2021-07-18: 120 ug via INTRAVENOUS

## 2021-07-18 MED ORDER — LIDOCAINE HCL (CARDIAC) PF 100 MG/5ML IV SOSY
PREFILLED_SYRINGE | INTRAVENOUS | Status: DC | PRN
  Administered 2021-07-18: 100 mg via INTRAVENOUS

## 2021-07-18 MED ORDER — EPHEDRINE SULFATE 50 MG/ML IJ SOLN
INTRAMUSCULAR | Status: DC | PRN
  Administered 2021-07-18: 15 mg via INTRAVENOUS

## 2021-07-18 MED ORDER — PROPOFOL 10 MG/ML IV BOLUS
INTRAVENOUS | Status: AC
  Filled 2021-07-18: qty 20

## 2021-07-18 MED ORDER — PROPOFOL 10 MG/ML IV BOLUS
INTRAVENOUS | Status: DC | PRN
  Administered 2021-07-18: 200 mg via INTRAVENOUS

## 2021-07-18 MED ORDER — ONDANSETRON HCL 4 MG/2ML IJ SOLN
INTRAMUSCULAR | Status: AC
  Filled 2021-07-18: qty 2

## 2021-07-18 MED ORDER — MIDAZOLAM HCL 2 MG/2ML IJ SOLN
INTRAMUSCULAR | Status: AC
  Filled 2021-07-18: qty 2

## 2021-07-18 MED ORDER — BUPIVACAINE HCL (PF) 0.25 % IJ SOLN
INTRAMUSCULAR | Status: AC
  Filled 2021-07-18: qty 30

## 2021-07-18 SURGICAL SUPPLY — 33 items
BLADE EXCALIBUR 4.0X13 (MISCELLANEOUS) ×3 IMPLANT
BNDG ELASTIC 6X5.8 VLCR STR LF (GAUZE/BANDAGES/DRESSINGS) ×3 IMPLANT
DISSECTOR 4.0MM X 13CM (MISCELLANEOUS) IMPLANT
DRAPE ARTHROSCOPY W/POUCH 90 (DRAPES) ×3 IMPLANT
DRSG EMULSION OIL 3X3 NADH (GAUZE/BANDAGES/DRESSINGS) ×3 IMPLANT
DURAPREP 26ML APPLICATOR (WOUND CARE) ×3 IMPLANT
ELECT MENISCUS 165MM 90D (ELECTRODE) IMPLANT
ELECT REM PT RETURN 9FT ADLT (ELECTROSURGICAL)
ELECTRODE REM PT RTRN 9FT ADLT (ELECTROSURGICAL) IMPLANT
GAUZE SPONGE 4X4 12PLY STRL (GAUZE/BANDAGES/DRESSINGS) ×3 IMPLANT
GLOVE SRG 8 PF TXTR STRL LF DI (GLOVE) ×4 IMPLANT
GLOVE SURG LTX SZ7.5 (GLOVE) ×6 IMPLANT
GLOVE SURG UNDER POLY LF SZ8 (GLOVE) ×6
GOWN STRL REUS W/ TWL LRG LVL3 (GOWN DISPOSABLE) ×2 IMPLANT
GOWN STRL REUS W/ TWL XL LVL3 (GOWN DISPOSABLE) ×2 IMPLANT
GOWN STRL REUS W/TWL 2XL LVL3 (GOWN DISPOSABLE) ×3 IMPLANT
GOWN STRL REUS W/TWL LRG LVL3 (GOWN DISPOSABLE) ×3
GOWN STRL REUS W/TWL XL LVL3 (GOWN DISPOSABLE) ×3
HOLDER KNEE FOAM BLUE (MISCELLANEOUS) ×3 IMPLANT
MANIFOLD NEPTUNE II (INSTRUMENTS) ×3 IMPLANT
NDL SAFETY ECLIPSE 18X1.5 (NEEDLE) IMPLANT
NEEDLE HYPO 18GX1.5 SHARP (NEEDLE)
PACK ARTHROSCOPY DSU (CUSTOM PROCEDURE TRAY) ×3 IMPLANT
PACK BASIN DAY SURGERY FS (CUSTOM PROCEDURE TRAY) ×3 IMPLANT
PAD CAST 4YDX4 CTTN HI CHSV (CAST SUPPLIES) ×2 IMPLANT
PADDING CAST COTTON 4X4 STRL (CAST SUPPLIES) ×3
PENCIL SMOKE EVACUATOR (MISCELLANEOUS) IMPLANT
SUT ETHILON 4 0 PS 2 18 (SUTURE) IMPLANT
SYR 5ML LL (SYRINGE) IMPLANT
TOWEL GREEN STERILE FF (TOWEL DISPOSABLE) ×3 IMPLANT
TUBING ARTHROSCOPY IRRIG 16FT (MISCELLANEOUS) ×3 IMPLANT
WATER STERILE IRR 1000ML POUR (IV SOLUTION) ×3 IMPLANT
WRAP KNEE MAXI GEL POST OP (GAUZE/BANDAGES/DRESSINGS) ×3 IMPLANT

## 2021-07-18 NOTE — Anesthesia Postprocedure Evaluation (Signed)
Anesthesia Post Note  Patient: Ernest [name]  Procedure(s) Performed: KNEE ARTHROSCOPY WITH MEDIAL MENISECTOMY (Right: Knee) CHONDROPLASTY (Knee)     Patient location during evaluation: PACU Anesthesia Type: General Level of consciousness: awake and alert Pain management: pain level controlled Vital Signs Assessment: post-procedure vital signs reviewed and stable Respiratory status: spontaneous breathing, nonlabored ventilation and respiratory function stable Cardiovascular status: blood pressure returned to baseline and stable Postop Assessment: no apparent nausea or vomiting Anesthetic complications: no   No notable events documented.  Last Vitals:  Vitals:   07/18/21 1530 07/18/21 1540  BP: 113/75 137/76  Pulse: 72 82  Resp: 12 16  Temp:  (!) 36.4 C  SpO2: 96% 94%    Last Pain:  Vitals:   07/18/21 1540  TempSrc:   PainSc: 0-No pain                 Lowella Curb

## 2021-07-18 NOTE — Transfer of Care (Signed)
Immediate Anesthesia Transfer of Care Note  Patient: Ernest [name]  Procedure(s) Performed: KNEE ARTHROSCOPY WITH MEDIAL MENISECTOMY (Right: Knee) CHONDROPLASTY (Knee)  Patient Location: PACU  Anesthesia Type:General  Level of Consciousness: awake, alert  and oriented  Airway & Oxygen Therapy: Patient Spontanous Breathing and Patient connected to face mask oxygen  Post-op Assessment: Report given to RN and Post -op Vital signs reviewed and stable  Post vital signs: Reviewed and stable  Last Vitals:  Vitals Value Taken Time  BP 100/62 07/18/21 1453  Temp    Pulse 69 07/18/21 1457  Resp 9 07/18/21 1457  SpO2 98 % 07/18/21 1457  Vitals shown include unvalidated device data.  Last Pain:  Vitals:   07/18/21 1109  TempSrc: Oral  PainSc: 0-No pain         Complications: No notable events documented.

## 2021-07-18 NOTE — Discharge Instructions (Addendum)
Post Anesthesia Home Care Instructions  Activity: Get plenty of rest for the remainder of the day. A responsible individual must stay with you for 24 hours following the procedure.  For the next 24 hours, DO NOT: -Drive a car -Advertising copywriter -Drink alcoholic beverages -Take any medication unless instructed by your physician -Make any legal decisions or sign important papers.  Meals: Start with liquid foods such as gelatin or soup. Progress to regular foods as tolerated. Avoid greasy, spicy, heavy foods. If nausea and/or vomiting occur, drink only clear liquids until the nausea and/or vomiting subsides. Call your physician if vomiting continues.  Special Instructions/Symptoms: Your throat may feel dry or sore from the anesthesia or the breathing tube placed in your throat during surgery. If this causes discomfort, gargle with warm salt water. The discomfort should disappear within 24 hours.  If you had a scopolamine patch placed behind your ear for the management of post- operative nausea and/or vomiting:  1. The medication in the patch is effective for 72 hours, after which it should be removed.  Wrap patch in a tissue and discard in the trash. Wash hands thoroughly with soap and water. 2. You may remove the patch earlier than 72 hours if you experience unpleasant side effects which may include dry mouth, dizziness or visual disturbances. 3. Avoid touching the patch. Wash your hands with soap and water after contact with the patch.    POST-OP KNEE ARTHROSCOPY INSTRUCTIONS  Dr. Cherlyn Roberts PA-C  Pain You will be expected to have a moderate amount of pain in the affected knee for approximately two weeks. However, the first two days will be the most severe pain. A prescription has been provided to take as needed for the pain. The pain can be reduced by applying ice packs to the knee for the first 1-2 weeks post surgery. Also, keeping the leg elevated on pillows will help  alleviate the pain. If you develop any acute pain or swelling in your calf muscle, please call the doctor.  Activity It is preferred that you stay at bed rest for approximately 24 hours. However, you may go to the bathroom with help. Weight bearing as tolerated. You may begin the knee exercises the day of surgery. Discontinue crutches as the knee pain resolves.  Dressing Keep the dressing dry. If the ace bandage should wrinkle or roll up, this can be rewrapped to prevent ridges in the bandage. You may remove all dressings in 48 hours,  apply bandaids to each wound. You may shower on the 4th day after surgery but no tub bath.  Symptoms to report to your doctor Extreme pain Extreme swelling Temperature above 101 degrees Change in the feeling, color, or movement of your toes Redness, heat, or swelling at your incision  Exercise If is preferred that as soon as possible you try to do a straight leg raise without bending the knee and concentrate on bringing the heel of your foot off the bed up to approximately 45 degrees and hold for the count of 10 seconds. Repeat this at least 10 times three or four times per day. Additional exercises are provided below.  You are encouraged to bend the knee as tolerated.  Follow-Up Call to schedule a follow-up appointment in 5-7 days. Our office # is [phone].  POST-OP EXERCISES  Short Arc Quads  1. Lie on back with legs straight. Place towel roll under thigh, just above knee. 2. Tighten thigh muscles to straighten knee and lift heel off  bed. 3. Hold for slow count of five, then lower. 4. Do three sets of ten    Straight Leg Raises  1. Lie on back with operative leg straight and other leg bent. 2. Keeping operative leg completely straight, slowly lift operative leg so foot is 5 inches off bed. 3. Hold for slow count of five, then lower. 4. Do three sets of ten.    DO BOTH EXERCISES 2 TIMES A DAY  Ankle Pumps  Work/move the operative ankle  and foot up and down 10 times every hour while awake.

## 2021-07-18 NOTE — H&P (Signed)
A pre op hand p   Chief Complaint: right knee pain  HPI: Ernest [name] is a 60 y.o. male who presents for evaluation of r knee pain. It has been present for greater than 3 months and has been worsening. He has failed conservative measures. Pain is rated as moderate.  Past Medical History:  Diagnosis Date   History of colonoscopy 08/20/13   Hypertension    Mixed hyperlipidemia 01/21/2014   Past Surgical History:  Procedure Laterality Date   COLONOSCOPY W/ POLYPECTOMY     COLONOSCOPY WITH PROPOFOL N/A 03/08/2015   Procedure: COLONOSCOPY WITH PROPOFOL;  Surgeon: Charolett Bumpers, MD;  Location: [facility];  Service: Endoscopy;  Laterality: N/A;   SHOULDER ARTHROSCOPY Right    tonselec     tonsillectomy   TONSILLECTOMY     Social History   Socioeconomic History   Marital status: Married    Spouse name: Not on file   Number of children: Not on file   Years of education: Not on file   Highest education level: Not on file  Occupational History   Not on file  Tobacco Use   Smoking status: Former    Packs/day: 1.00    Years: 30.00    Pack years: 30.00    Types: Cigarettes   Smokeless tobacco: Never  Vaping Use   Vaping Use: Never used  Substance and Sexual Activity   Alcohol use: No   Drug use: No   Sexual activity: Not on file  Other Topics Concern   Not on file  Social History Narrative   Not on file   Social Determinants of Health   Financial Resource Strain: Not on file  Food Insecurity: Not on file  Transportation Needs: Not on file  Physical Activity: Not on file  Stress: Not on file  Social Connections: Not on file   Family History  Problem Relation Age of Onset   Heart disease Father    Heart attack Father    Heart disease Paternal Uncle    Heart attack Paternal Uncle    No Known Allergies Prior to Admission medications   Medication Sig Start Date End Date Taking? Authorizing Provider  ALPRAZolam Prudy Feeler) 0.25 MG tablet Take 1 tablet by mouth as  needed for anxiety. 12/23/17  Yes [provider]  amLODipine-olmesartan (AZOR) 5-20 MG tablet Take 1 tablet by mouth daily. 04/15/19  Yes [provider]  aspirin 81 MG chewable tablet Chew 81 mg by mouth daily.    Yes [provider]  atorvastatin (LIPITOR) 40 MG tablet Take 1 tablet (40 mg total) by mouth daily. 06/03/21  Yes Corky Crafts, MD  buPROPion [facility] SR) 150 MG 12 hr tablet Take 150 mg by mouth 2 (two) times daily.   Yes [provider]  hydrochlorothiazide (HYDRODIURIL) 12.5 MG tablet Take 12.5 mg by mouth 2 (two) times daily.   Yes [provider]  Omega-3 Fatty Acids (FISH OIL) 1000 MG CAPS Take 2 capsules (2,000 mg total) by mouth 2 (two) times daily. 01/21/14  Yes Corky Crafts, MD  ondansetron (ZOFRAN) 4 MG tablet Take 1 tablet (4 mg total) by mouth every 8 (eight) hours as needed for nausea or vomiting. 05/08/20  Yes Janetta Hora, PA-C  SYNTHROID 100 MCG tablet Take 100 mcg by mouth daily. 05/16/20  Yes [provider]  VIAGRA 100 MG tablet Take 100 mg by mouth daily as needed for erectile dysfunction.  12/31/15  Yes [provider]  zolpidem (AMBIEN) 5 MG tablet Take 5 mg by mouth at bedtime as needed for sleep.   Yes [provider]     Positive ROS: none  All other systems have been reviewed and were otherwise negative with the exception of those mentioned in the HPI and as above.  Physical Exam: Vitals:   07/18/21 1109  BP: (!) 154/51  Pulse: 64  Resp: 20  Temp: 98 F (36.7 C)  SpO2: 100%    General: Alert, no acute distress Cardiovascular: No pedal edema Respiratory: No cyanosis, no use of accessory musculature GI: No organomegaly, abdomen is soft and non-tender Skin: No lesions in the area of chief complaint Neurologic: Sensation intact distally Psychiatric: Patient is competent for consent with normal mood and affect Lymphatic: No axillary or cervical  lymphadenopathy  MUSCULOSKELETAL: r knee: +mcmurray taest -instabilkity, painful rom limited rom   Assessment/Plan: RIGHT KNEE MEDIAL MENISCAL TEAR Plan for Procedure(s): ARTHROSCOPY KNEE  The risks benefits and alternatives were discussed with the patient including but not limited to the risks of nonoperative treatment, versus surgical intervention including infection, bleeding, nerve injury, malunion, nonunion, hardware prominence, hardware failure, need for hardware removal, blood clots, cardiopulmonary complications, morbidity, mortality, among others, and they were willing to proceed.  Predicted outcome is good, although there will be at least a six to nine month expected recovery.  [name], MD 07/18/2021 1:25 PM

## 2021-07-18 NOTE — Anesthesia Preprocedure Evaluation (Signed)
Anesthesia Evaluation  Patient identified by MRN, date of birth, ID band Patient awake    Reviewed: Allergy & Precautions, H&P , NPO status , Patient's Chart, lab work & pertinent test results  Airway Mallampati: II  TM Distance: >3 FB Neck ROM: full    Dental no notable dental hx. (+) Teeth Intact, Dental Advisory Given   Pulmonary former smoker,    Pulmonary exam normal breath sounds clear to auscultation       Cardiovascular Exercise Tolerance: Good hypertension, Pt. on medications negative cardio ROS Normal cardiovascular exam Rhythm:regular Rate:Normal     Neuro/Psych negative neurological ROS  negative psych ROS   GI/Hepatic negative GI ROS, Neg liver ROS,   Endo/Other  negative endocrine ROS  Renal/GU negative Renal ROS  negative genitourinary   Musculoskeletal   Abdominal   Peds  Hematology negative hematology ROS (+)   Anesthesia Other Findings   Reproductive/Obstetrics negative OB ROS                             Anesthesia Physical  Anesthesia Plan  ASA: II  Anesthesia Plan: General   Post-op Pain Management:    Induction: Intravenous  PONV Risk Score and Plan: 2 and Ondansetron, Midazolam and Treatment may vary due to age or medical condition  Airway Management Planned: LMA  Additional Equipment:   Intra-op Plan:   Post-operative Plan: Extubation in OR  Informed Consent: I have reviewed the patients History and Physical, chart, labs and discussed the procedure including the risks, benefits and alternatives for the proposed anesthesia with the patient or authorized representative who has indicated his/her understanding and acceptance.     Dental Advisory Given  Plan Discussed with: CRNA and Surgeon  Anesthesia Plan Comments:         Anesthesia Quick Evaluation

## 2021-07-18 NOTE — Brief Op Note (Signed)
07/18/2021  2:47 PM  PATIENT:  Ernest [name]  60 y.o. male  PRE-OPERATIVE DIAGNOSIS:  RIGHT KNEE MEDIAL MENISCAL TEAR  POST-OPERATIVE DIAGNOSIS:  RIGHT KNEE MEDIAL MENISCAL TEAR  PROCEDURE:  Procedure(s): KNEE ARTHROSCOPY WITH MEDIAL MENISECTOMY (Right) CHONDROPLASTY  SURGEON:  Surgeon(s) and Role:    Jodi Geralds, MD - Primary  PHYSICIAN ASSISTANT:   ASSISTANTS: jim bethune   ANESTHESIA:   general  EBL:  15 mL   BLOOD ADMINISTERED:none  DRAINS: none   LOCAL MEDICATIONS USED:  MARCAINE     SPECIMEN:  No Specimen  DISPOSITION OF SPECIMEN:  N/A  COUNTS:  YES  TOURNIQUET:  * No tourniquets in log *  DICTATION: .Other Dictation: Dictation Number [id number]  PLAN OF CARE: Discharge to home after PACU  PATIENT DISPOSITION:  PACU - hemodynamically stable.   Delay start of Pharmacological VTE agent (>24hrs) due to surgical blood loss or risk of bleeding: no

## 2021-07-18 NOTE — Anesthesia Procedure Notes (Signed)
Procedure Name: LMA Insertion Date/Time: 07/18/2021 2:17 PM Performed by: Karen Kitchens, CRNA Pre-anesthesia Checklist: Patient identified, Emergency Drugs available, Suction available and Patient being monitored Patient Re-evaluated:Patient Re-evaluated prior to induction Oxygen Delivery Method: Circle system utilized Preoxygenation: Pre-oxygenation with 100% oxygen Induction Type: IV induction Ventilation: Mask ventilation without difficulty LMA: LMA inserted LMA Size: 4.0 Number of attempts: 1 Airway Equipment and Method: Bite block Placement Confirmation: positive ETCO2, CO2 detector and breath sounds checked- equal and bilateral Tube secured with: Tape Dental Injury: Teeth and Oropharynx as per pre-operative assessment

## 2021-07-19 ENCOUNTER — Encounter (HOSPITAL_BASED_OUTPATIENT_CLINIC_OR_DEPARTMENT_OTHER): Payer: Self-pay | Admitting: Orthopedic Surgery

## 2021-07-19 NOTE — Op Note (Signed)
Ernest, [name] MEDICAL RECORD NO: [id number] ACCOUNT NO: [id number] DATE OF BIRTH: [birth year] FACILITY: [facility] LOCATION: [facility] PHYSICIAN: [name], MD  Operative Report   DATE OF PROCEDURE: 07/18/2021  PREOPERATIVE DIAGNOSIS:  Medial meniscal tear, right knee.  POSTOPERATIVE DIAGNOSES: 1.  Medial meniscal tear, right knee. 2.  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 3.  Medial shelf plica.  PROCEDURE PERFORMED:   1.  Right knee arthroscopy with partial medial meniscectomy. 2.  Abrasion arthroplasty, medial femoral condyle, right knee down to bleeding bone where necessary. 3.  Medial plica resection, right knee.  SURGEON:  [name], MD.  Threasa HeadsOrma Flaming.  ANESTHESIA:  General.  BRIEF HISTORY:  The patient is a 60 year old male with a history of significant complaints of right knee pain.  We treated conservatively for a period of time after failed conservative care, MRI was obtained, which showed that he had a significant medial  meniscal tear.  He was taken to the operating room after failure of all conservative care for evaluation.  DESCRIPTION OF PROCEDURE:  The patient was taken to the operating room.  After adequate anesthesia was obtained with a general anesthetic, the patient placed supine on the operating table.  The right leg was prepped and draped in the usual sterile  fashion.  Following this, routine arthroscopic examination of the knee revealed that there was a significant medial meniscal tear with flaps and folded over meniscus, both anteriorly and posteriorly.  This was identified and with skilled retraction of  the leg by Gus Puma, PA-C, we were able to see the posterior aspect of the knee and the flap posteriorly was able to be bitten with a straight biting forceps.  The anterior leaflet was debrided with a suction shaver up on to the flap that was folded  anteriorly and superiorly.  Once this was  done, a probe was used to make sure that we had gotten all the torn meniscus out.  At this point, the remaining meniscal rim was contoured down with a suction shaver.  Attention was then turned to the medial  femoral condyle, which had some grade 2, grade 3, and small areas of grade 4 change.  An abrasion arthroplasty was performed from the medial compartment over much of the duration of probably a 2 x 2 cm area.  The scope was exchanged and the shaver  brought in from the other side just to be able to complete the abrasion arthroplasty as it was an extensive area and we did go down to bleeding bone in one area.  At this point, attention was turned to the ACL, normal.  Attention was turned to the  lateral side, normal.  Attention turned back to the patellofemoral joint where the dense and draping the medial shelf plica, which was blocking access into the medial compartment had been partially removed.  We did remove the remaining portions of that  medial plica, which gave access to the patellofemoral compartment, really not dramatic in terms of the patellofemoral change.  At this point, the knee was copiously and thoroughly irrigated and suctioned dry.  Final check for any loose fragment pieces of  meniscus and cartilage, none were seen.  The knee again was irrigated and suctioned dry and the instruments were removed.  The 25 mL of 0.25% Marcaine was instilled in the knee for postoperative pain control.  The patient was taken to recovery and was  noted to be in  satisfactory condition after sterile compressive dressing was applied.  The estimated blood loss for the procedure was minimal.  COMPLICATIONS:  None.   PUS D: 07/18/2021 2:52:04 pm T: 07/19/2021 4:15:00 am  JOB: [id number]/ [id number]

## 2021-07-21 DIAGNOSIS — L905 Scar conditions and fibrosis of skin: Secondary | ICD-10-CM | POA: Diagnosis not present

## 2021-07-21 DIAGNOSIS — Z08 Encounter for follow-up examination after completed treatment for malignant neoplasm: Secondary | ICD-10-CM | POA: Diagnosis not present

## 2021-07-21 DIAGNOSIS — Z85828 Personal history of other malignant neoplasm of skin: Secondary | ICD-10-CM | POA: Diagnosis not present

## 2021-07-25 DIAGNOSIS — M25661 Stiffness of right knee, not elsewhere classified: Secondary | ICD-10-CM | POA: Diagnosis not present

## 2021-09-02 ENCOUNTER — Other Ambulatory Visit: Payer: BC Managed Care – PPO | Admitting: *Deleted

## 2021-09-02 ENCOUNTER — Other Ambulatory Visit: Payer: Self-pay

## 2021-09-02 ENCOUNTER — Other Ambulatory Visit: Payer: Self-pay | Admitting: *Deleted

## 2021-09-02 DIAGNOSIS — E782 Mixed hyperlipidemia: Secondary | ICD-10-CM

## 2021-09-02 LAB — HEPATIC FUNCTION PANEL
ALT: 17 IU/L (ref 0–44)
AST: 16 IU/L (ref 0–40)
Albumin: 4.4 g/dL (ref 3.8–4.8)
Alkaline Phosphatase: 97 IU/L (ref 44–121)
Bilirubin Total: 0.2 mg/dL (ref 0.0–1.2)
Bilirubin, Direct: <0.1 mg/dL (ref 0.00–0.40)
Total Protein: 6.2 g/dL (ref 6.0–8.5)

## 2021-09-02 LAB — LIPID PANEL
Chol/HDL Ratio: 3.8 ratio (ref 0.0–5.0)
Cholesterol, Total: 149 mg/dL (ref 100–199)
HDL: 39 mg/dL — ABNORMAL LOW (ref >39)
LDL Chol Calc (NIH): 90 mg/dL (ref 0–99)
Triglycerides: 111 mg/dL (ref 0–149)
VLDL Cholesterol Cal: 20 mg/dL (ref 5–40)

## 2021-09-27 DIAGNOSIS — R7309 Other abnormal glucose: Secondary | ICD-10-CM | POA: Diagnosis not present

## 2021-09-27 DIAGNOSIS — Z125 Encounter for screening for malignant neoplasm of prostate: Secondary | ICD-10-CM | POA: Diagnosis not present

## 2021-09-27 DIAGNOSIS — E538 Deficiency of other specified B group vitamins: Secondary | ICD-10-CM | POA: Diagnosis not present

## 2021-09-27 DIAGNOSIS — Z0000 Encounter for general adult medical examination without abnormal findings: Secondary | ICD-10-CM | POA: Diagnosis not present

## 2021-09-27 DIAGNOSIS — E039 Hypothyroidism, unspecified: Secondary | ICD-10-CM | POA: Diagnosis not present

## 2021-09-27 DIAGNOSIS — E7800 Pure hypercholesterolemia, unspecified: Secondary | ICD-10-CM | POA: Diagnosis not present

## 2021-09-28 ENCOUNTER — Ambulatory Visit: Payer: BC Managed Care – PPO | Admitting: Interventional Cardiology

## 2021-09-28 ENCOUNTER — Encounter: Payer: Self-pay | Admitting: Interventional Cardiology

## 2021-09-28 ENCOUNTER — Other Ambulatory Visit: Payer: Self-pay

## 2021-09-28 VITALS — BP 130/80 | HR 78 | Ht 70.0 in | Wt 176.0 lb

## 2021-09-28 DIAGNOSIS — E782 Mixed hyperlipidemia: Secondary | ICD-10-CM | POA: Diagnosis not present

## 2021-09-28 DIAGNOSIS — Z8249 Family history of ischemic heart disease and other diseases of the circulatory system: Secondary | ICD-10-CM | POA: Diagnosis not present

## 2021-09-28 DIAGNOSIS — Z720 Tobacco use: Secondary | ICD-10-CM | POA: Diagnosis not present

## 2021-09-28 DIAGNOSIS — N1831 Chronic kidney disease, stage 3a: Secondary | ICD-10-CM | POA: Diagnosis not present

## 2021-09-28 NOTE — Patient Instructions (Signed)
Medication Instructions:  Your physician recommends that you continue on your current medications as directed. Please refer to the Current Medication list given to you today.  *If you need a refill on your cardiac medications before your next appointment, please call your pharmacy*   Lab Work: none If you have labs (blood work) drawn today and your tests are completely normal, you will receive your results only by: MyChart Message (if you have MyChart) OR A paper copy in the mail If you have any lab test that is abnormal or we need to change your treatment, we will call you to review the results.   Testing/Procedures: none   Follow-Up: At [facility], you and your health needs are our priority.  As part of our continuing mission to provide you with exceptional heart care, we have created designated Provider Care Teams.  These Care Teams include your primary Cardiologist (physician) and Advanced Practice Providers (APPs -  Physician Assistants and Nurse Practitioners) who all work together to provide you with the care you need, when you need it.  We recommend signing up for the patient portal called "MyChart".  Sign up information is provided on this After Visit Summary.  MyChart is used to connect with patients for Virtual Visits (Telemedicine).  Patients are able to view lab/test results, encounter notes, upcoming appointments, etc.  Non-urgent messages can be sent to your provider as well.   To learn more about what you can do with MyChart, go to [email or web address].    Your next appointment:   12 month(s)  The format for your next appointment:   In Person   Provider:   Dr Eldridge Dace   Other Instructions High-Fiber Eating Plan Fiber, also called dietary fiber, is a type of carbohydrate. It is found foods such as fruits, vegetables, whole grains, and beans. A high-fiber diet can have many health benefits. Your health care provider may recommend a high-fiber diet to  help: Prevent constipation. Fiber can make your bowel movements more regular. Lower your cholesterol. Relieve the following conditions: Inflammation of veins in the anus (hemorrhoids). Inflammation of specific areas of the digestive tract (uncomplicated diverticulosis). A problem of the large intestine, also called the colon, that sometimes causes pain and diarrhea (irritable bowel syndrome, or IBS). Prevent overeating as part of a weight-loss plan. Prevent heart disease, type 2 diabetes, and certain cancers. What are tips for following this plan? Reading food labels  Check the nutrition facts label on food products for the amount of dietary fiber. Choose foods that have 5 grams of fiber or more per serving. The goals for recommended daily fiber intake include: Men (age 40 or younger): 34-38 g. Men (over age 22): 28-34 g. Women (age 9 or younger): 25-28 g. Women (over age 55): 22-25 g. Your daily fiber goal is _____________ g. Shopping Choose whole fruits and vegetables instead of processed forms, such as apple juice or applesauce. Choose a wide variety of high-fiber foods such as avocados, lentils, oats, and kidney beans. Read the nutrition facts label of the foods you choose. Be aware of foods with added fiber. These foods often have high sugar and sodium amounts per serving. Cooking Use whole-grain flour for baking and cooking. Cook with brown rice instead of white rice. Meal planning Start the day with a breakfast that is high in fiber, such as a cereal that contains 5 g of fiber or more per serving. Eat breads and cereals that are made with whole-grain flour instead of refined flour or  white flour. Eat brown rice, bulgur wheat, or millet instead of white rice. Use beans in place of meat in soups, salads, and pasta dishes. Be sure that half of the grains you eat each day are whole grains. General information You can get the recommended daily intake of dietary fiber by: Eating a  variety of fruits, vegetables, grains, nuts, and beans. Taking a fiber supplement if you are not able to take in enough fiber in your diet. It is better to get fiber through food than from a supplement. Gradually increase how much fiber you consume. If you increase your intake of dietary fiber too quickly, you may have bloating, cramping, or gas. Drink plenty of water to help you digest fiber. Choose high-fiber snacks, such as berries, raw vegetables, nuts, and popcorn. What foods should I eat? Fruits Berries. Pears. Apples. Oranges. Avocado. Prunes and raisins. Dried figs. Vegetables Sweet potatoes. Spinach. Kale. Artichokes. Cabbage. Broccoli. Cauliflower. Green peas. Carrots. Squash. Grains Whole-grain breads. Multigrain cereal. Oats and oatmeal. Brown rice. Barley. Bulgur wheat. Millet. Quinoa. Bran muffins. Popcorn. Rye wafer crackers. Meats and other proteins Navy beans, kidney beans, and pinto beans. Soybeans. Split peas. Lentils. Nuts and seeds. Dairy Fiber-fortified yogurt. Beverages Fiber-fortified soy milk. Fiber-fortified orange juice. Other foods Fiber bars. The items listed above may not be a complete list of recommended foods and beverages. Contact a dietitian for more information. What foods should I avoid? Fruits Fruit juice. Cooked, strained fruit. Vegetables Fried potatoes. Canned vegetables. Well-cooked vegetables. Grains White bread. Pasta made with refined flour. White rice. Meats and other proteins Fatty cuts of meat. Fried chicken or fried fish. Dairy Milk. Yogurt. Cream cheese. Sour cream. Fats and oils Butters. Beverages Soft drinks. Other foods Cakes and pastries. The items listed above may not be a complete list of foods and beverages to avoid. Talk with your dietitian about what choices are best for you. Summary Fiber is a type of carbohydrate. It is found in foods such as fruits, vegetables, whole grains, and beans. A high-fiber diet has many  benefits. It can help to prevent constipation, lower blood cholesterol, aid weight loss, and reduce your risk of heart disease, diabetes, and certain cancers. Increase your intake of fiber gradually. Increasing fiber too quickly may cause cramping, bloating, and gas. Drink plenty of water while you increase the amount of fiber you consume. The best sources of fiber include whole fruits and vegetables, whole grains, nuts, seeds, and beans. This information is not intended to replace advice given to you by your health care provider. Make sure you discuss any questions you have with your health care provider. Document Revised: 01/15/2020 Document Reviewed: 01/15/2020 Elsevier Patient Education  2022 [employer].

## 2021-09-28 NOTE — Progress Notes (Signed)
Cardiology Office Note   Date:  09/28/2021   ID:  Ernest [name], DOB 11-Jan-1961, MRN [id number]  PCP:  Marden Noble, MD    Chief Complaint  Patient presents with   Follow-up   CAD  Wt Readings from Last 3 Encounters:  09/28/21 176 lb (79.8 kg)  07/18/21 174 lb 9.7 oz (79.2 kg)  06/17/20 184 lb 3.2 oz (83.6 kg)       History of Present Illness: Ernest [name] is a 61 y.o. male   who has had a strong family h/o CAD. His father died of an MI at age 55. His father was a heavy smoker. In 2014, he had some left sided chest discomfort that would come and go quickly. It was on and off for over a day.   He had an ETT and went 10 minutes without sx or ECG changes.    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  No other exercise.  He does  A lot of yard work still.  He gets over 10K steps as noted by his phone. He quit smoking in 7/18.   Normal ETT in 6/18.    In 3/19, LDL was 127 so atorvastatin was increased to 20 mg daily.  No side effects on the medicine.   In 2020, had cough induced syncope.    He had COVID in 8/21.  He got the antibody treatment and recovered. He now believes in the vaccine.  Quit smoking.  No COVID issues.  Calcium scoring test: "Coronary Calcium Score:   Left main: 1   Left anterior descending artery: 7   Left circumflex artery: 21   Right coronary artery: 259   Total: 288   Percentile: 84   Pericardium: Normal.   Aorta: Normal caliber of ascending aorta. No aortic atherosclerosis noted."  He stays active.  He walks a lot at work.  He continues to work for [employer] provider but the name has changed to [employer].    Past Medical History:  Diagnosis Date   History of colonoscopy 08/20/13   Hypertension    Mixed hyperlipidemia 01/21/2014    Past Surgical History:  Procedure Laterality Date   CHONDROPLASTY  07/18/2021   Procedure: CHONDROPLASTY;  Surgeon: Jodi Geralds, MD;  Location: [facility];  Service: Orthopedics;;   COLONOSCOPY W/ POLYPECTOMY     COLONOSCOPY WITH PROPOFOL N/A 03/08/2015   Procedure: COLONOSCOPY WITH PROPOFOL;  Surgeon: Charolett Bumpers, MD;  Location: [facility];  Service: Endoscopy;  Laterality: N/A;   KNEE ARTHROSCOPY WITH MEDIAL MENISECTOMY Right 07/18/2021   Procedure: KNEE ARTHROSCOPY WITH MEDIAL MENISECTOMY;  Surgeon: Jodi Geralds, MD;  Location: [facility];  Service: Orthopedics;  Laterality: Right;   SHOULDER ARTHROSCOPY Right    tonselec     tonsillectomy   TONSILLECTOMY       Current Outpatient Medications  Medication Sig Dispense Refill   ALPRAZolam (XANAX) 0.25 MG tablet Take 1 tablet by mouth as needed for anxiety.  1   amLODipine-olmesartan (AZOR) 5-20 MG tablet Take 1 tablet by mouth daily.     aspirin 81 MG chewable tablet Chew 81 mg by mouth daily.      atorvastatin (LIPITOR) 20 MG tablet Take 20 mg by mouth daily.     buPROPion (WELLBUTRIN XL) 150 MG 24 hr tablet Take 450 mg by mouth every morning.     hydrochlorothiazide (HYDRODIURIL) 12.5 MG tablet Take 12.5 mg by mouth  2 (two) times daily.     Omega-3 Fatty Acids (FISH OIL) 1000 MG CAPS Take 2 capsules (2,000 mg total) by mouth 2 (two) times daily.  0   ondansetron (ZOFRAN) 4 MG tablet Take 1 tablet (4 mg total) by mouth every 8 (eight) hours as needed for nausea or vomiting. 20 tablet 0   SYNTHROID 100 MCG tablet Take 100 mcg by mouth daily.     VIAGRA 100 MG tablet Take 100 mg by mouth daily as needed for erectile dysfunction.   6   zolpidem (AMBIEN) 5 MG tablet Take 5 mg by mouth at bedtime as needed for sleep.     No current facility-administered medications for this visit.    Allergies:   Patient has no known allergies.    Social History:  The patient  reports that he has quit smoking. His smoking use included cigarettes. He has a 30.00 pack-year smoking history. He has never used smokeless tobacco. He reports that he does not drink alcohol and does not  use drugs.   Family History:  The patient's family history includes Heart attack in his father and paternal uncle; Heart disease in his father and paternal uncle.    ROS:  Please see the history of present illness.   Otherwise, review of systems are positive for frustration with new insurance company-he has had to fight several charges.   All other systems are reviewed and negative.    PHYSICAL EXAM: VS:  BP 130/80    Pulse 78    Ht 5\' 10"  (1.778 m)    Wt 176 lb (79.8 kg)    SpO2 98%    BMI 25.25 kg/m  , BMI Body mass index is 25.25 kg/m. GEN: Well nourished, well developed, in no acute distress HEENT: normal Neck: no JVD, carotid bruits, or masses Cardiac: RRR; no murmurs, rubs, or gallops,no edema  Respiratory:  clear to auscultation bilaterally, normal work of breathing GI: soft, nontender, nondistended, + BS MS: no deformity or atrophy Skin: warm and dry, no rash Neuro:  Strength and sensation are intact Psych: euthymic mood, full affect   EKG:   The ekg ordered today demonstrates NSR. No ST segment changes   Recent Labs: 07/15/2021: BUN 15; Creatinine, Ser 1.47; Potassium 4.6; Sodium 139 09/02/2021: ALT 17   Lipid Panel    Component Value Date/Time   CHOL 149 09/02/2021 0734   TRIG 111 09/02/2021 0734   HDL 39 (L) 09/02/2021 0734   CHOLHDL 3.8 09/02/2021 0734   LDLCALC 90 09/02/2021 0734     Other studies Reviewed: Additional studies/ records that were reviewed today with results demonstrating: labs reviewed.   ASSESSMENT AND PLAN:  Family h/o CAD: No change in family history.  No cardiac sx.  High calcium score by CT in 2022.  I stressed the importance of preventive therapy and healthy lifestyle.   Stays active with his job. Hyperlipidemia: High dose atorvastatin. LDL 90.  If lipids increase, could switch to rosuvastatin 40 mg daily. 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  Needs to stop.  CKD: Stay hydrated.  Avoid nephrotoxins.  Creatinine now  1.47.  He does not drink a lot of water at work to avoid having to use the bathroom.  He may need to change this practice.  We spoke about avoiding NSAIDs.   Current medicines are reviewed at length with the patient today.  The patient concerns regarding his medicines were addressed.  The following changes have  been made:  No change  Labs/ tests ordered today include:  No orders of the defined types were placed in this encounter.   Recommend 150 minutes/week of aerobic exercise Low fat, low carb, high fiber diet recommended  Disposition:   FU in 1 year   Signed, Lance Muss, MD  09/28/2021 8:52 AM    [facility] [address] [place], [place], [place]  [address] Phone: [phone]; Fax: [phone]

## 2021-10-13 ENCOUNTER — Encounter: Payer: Self-pay | Admitting: Interventional Cardiology

## 2021-10-13 NOTE — Telephone Encounter (Signed)
Pt aware records indicate Atorvastatin 20 mg Per pt has been taking 20 mg Will continue base on last lab results ./cy

## 2021-10-31 DIAGNOSIS — J209 Acute bronchitis, unspecified: Secondary | ICD-10-CM | POA: Diagnosis not present

## 2021-10-31 DIAGNOSIS — R0981 Nasal congestion: Secondary | ICD-10-CM | POA: Diagnosis not present

## 2021-12-30 IMAGING — CT CT CARDIAC CORONARY ARTERY CALCIUM SCORE
3 series · 14 of 20 positions shown, 16 images · non-contrast
Comparison: None.
COMPARISON: None.

Addendum:
EXAM:
OVER-READ INTERPRETATION  CT CHEST

The following report is an over-read performed by radiologist Dr.
Kaabachi Aamri [REDACTED] on 05/31/2021. This
over-read does not include interpretation of cardiac or coronary
anatomy or pathology. The coronary calcium score interpretation by
the cardiologist is attached.
CLINICAL DATA: Cardiovascular Disease Risk stratification
Coronary Calcium Score
TECHNIQUE: A gated, non-contrast computed tomography scan of the heart was
performed using 3mm slice thickness. Axial images were analyzed on a
dedicated workstation. Calcium scoring of the coronary arteries was
performed using the Agatston method.

[Series 2: cascseq 2.0 sa36 70% (id) · axial · 0.42mm/px · z∈[-272,-180]mm · 4 of 75 slices shown]
[im 15/75  vessel]
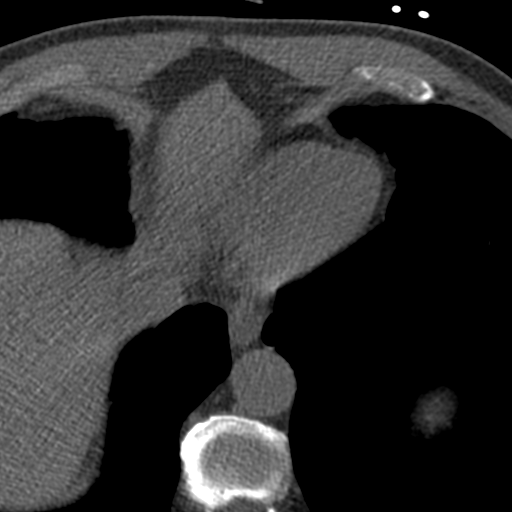
[im 30/75  vessel]
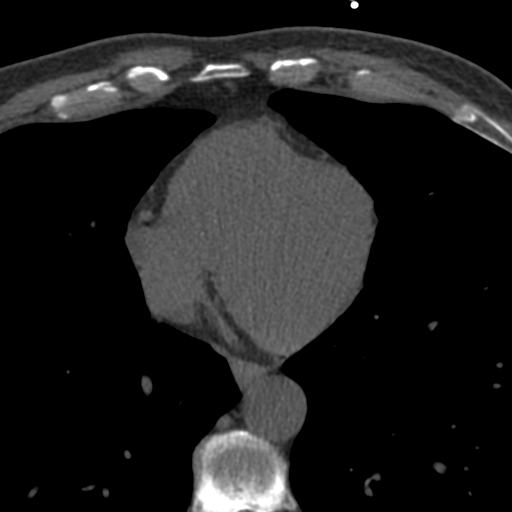
[im 45/75  vessel]
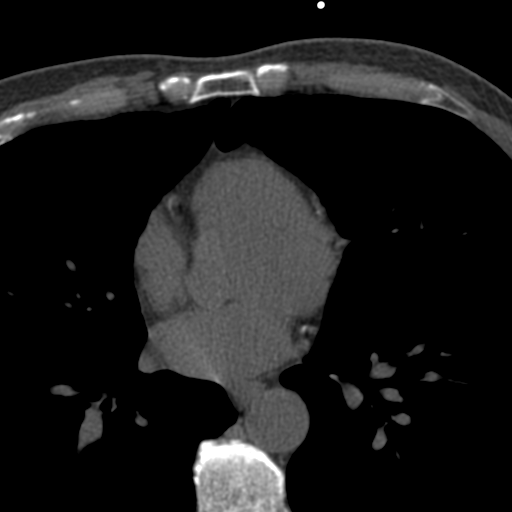
[im 60/75  vessel]
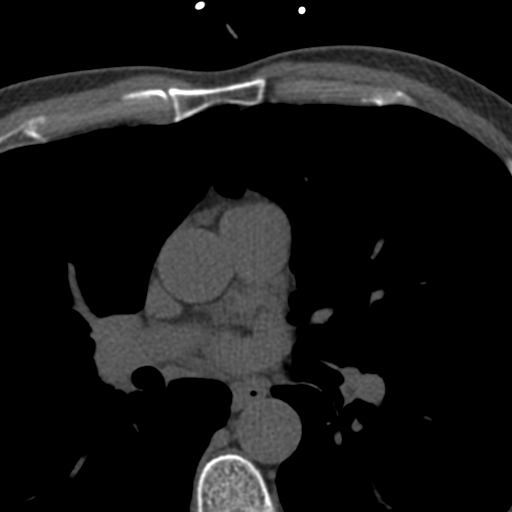

[Series 3: cascseq 2.0 bf37 st · axial · 0.75mm/px · z∈[-276,-176]mm · 5 of 76 slices shown, 7 images]
[im 13/76  vessel]
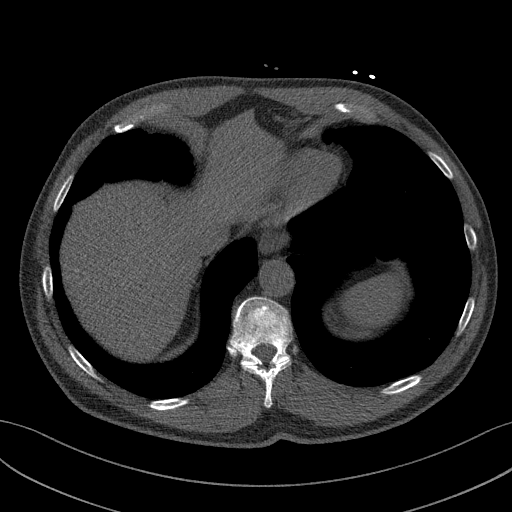
[im 13/76  lung]
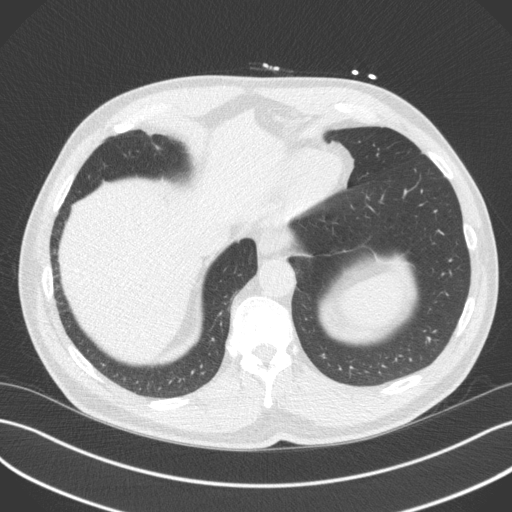
[im 26/76  vessel]
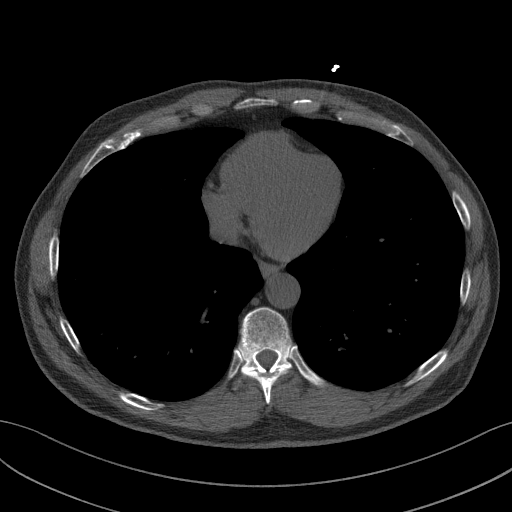
[im 38/76  vessel]
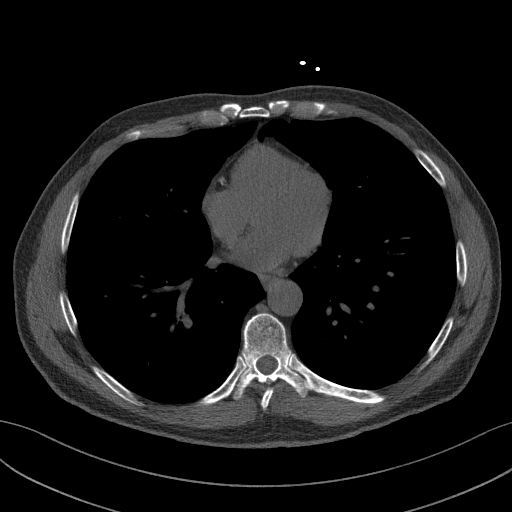
[im 51/76  vessel]
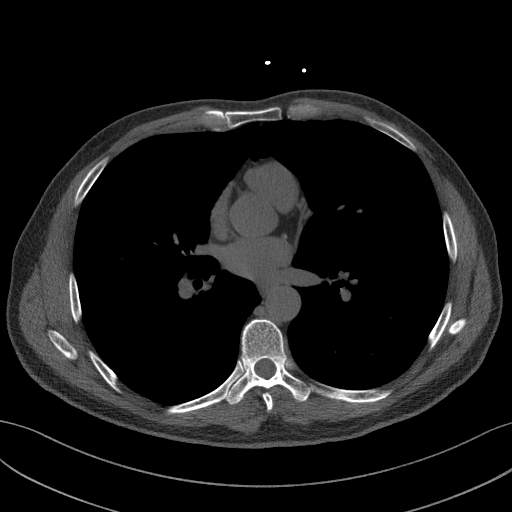
[im 63/76  vessel]
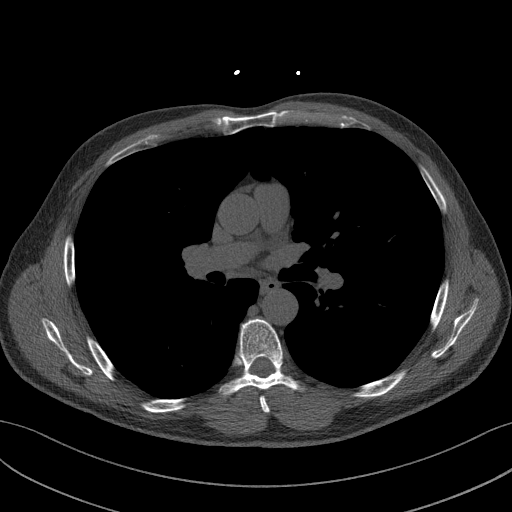
[im 63/76  lung]
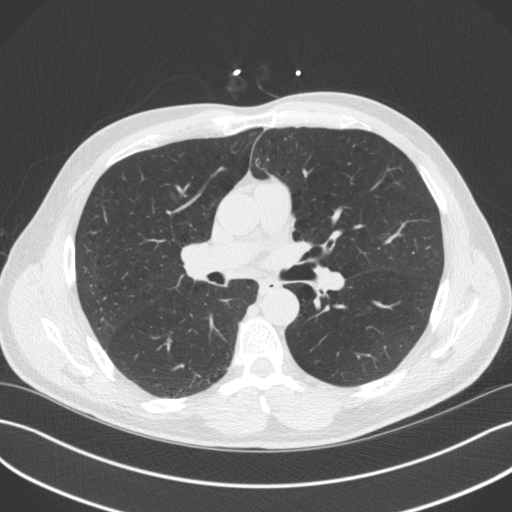

[Series 4: cascseq 2.0 br59 lung · axial · 0.75mm/px · z∈[-276,-176]mm · 5 of 76 slices shown]
[im 13/76  lung]
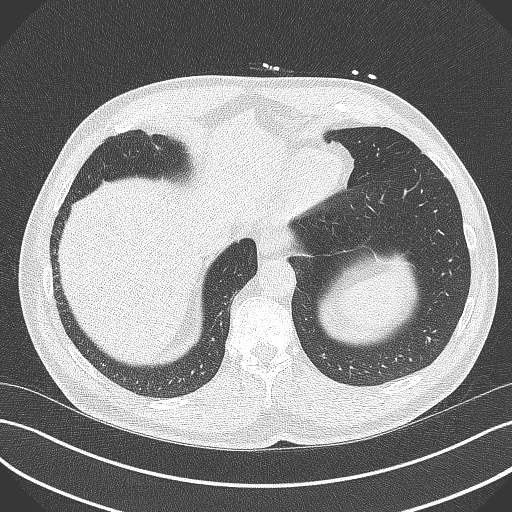
[im 26/76  lung]
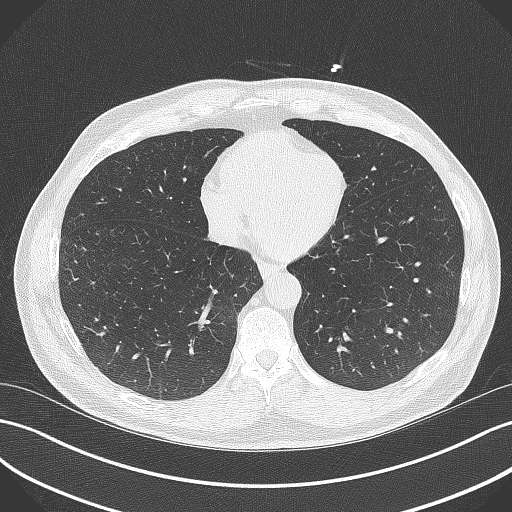
[im 38/76  lung]
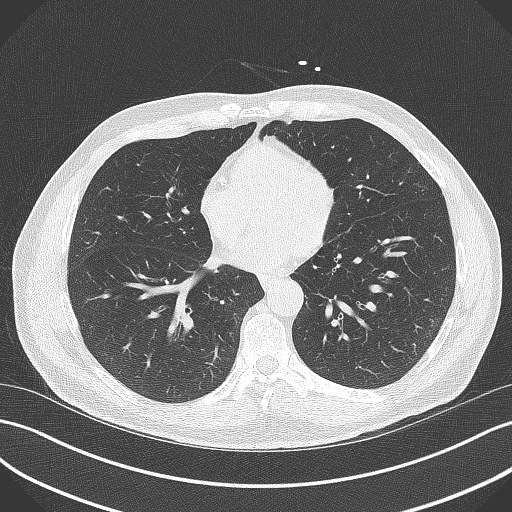
[im 51/76  lung]
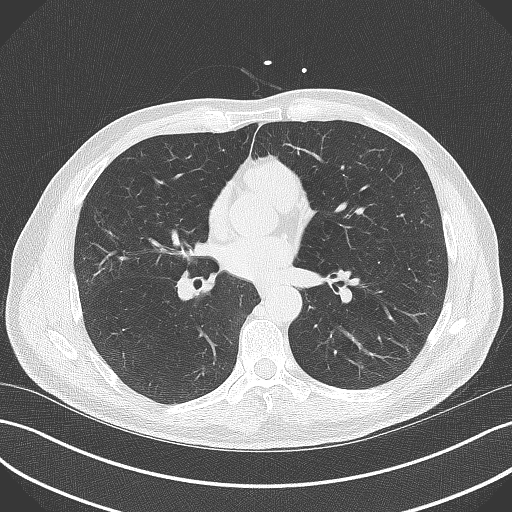
[im 63/76  lung]
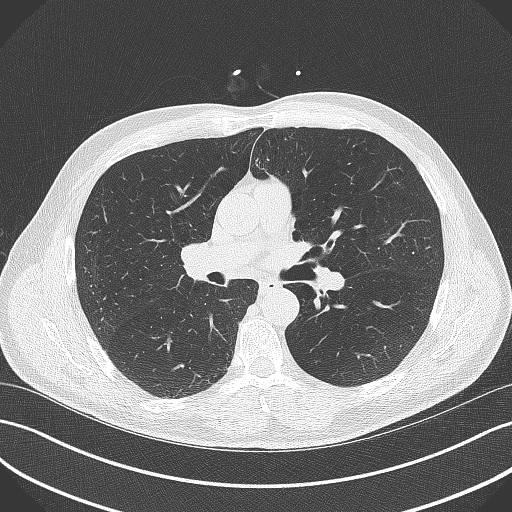

[14 of 20 positions shown; findings below may reference images not displayed]

FINDINGS: Aortic atherosclerosis. Within the visualized portions of the thorax
there are no suspicious appearing pulmonary nodules or masses, there
is no acute consolidative airspace disease, no pleural effusions, no
pneumothorax and no lymphadenopathy. Visualized portions of the
upper abdomen are unremarkable. There are no aggressive appearing
lytic or blastic lesions noted in the visualized portions of the
skeleton.
IMPRESSION: 1.  Aortic Atherosclerosis (AR79F-O1T.T).
FINDINGS: Coronary arteries: Normal origins.

Coronary Calcium Score:

Left main: 1

Left anterior descending artery: 7

Left circumflex artery: 21

Right coronary artery: 259

Total: 288

Percentile: 84

Pericardium: Normal.

Aorta: Normal caliber of ascending aorta. No aortic atherosclerosis
noted.

Non-cardiac: See separate report from [REDACTED].
IMPRESSION: Coronary calcium score of 288. This was 84th percentile for age-,
race-, and sex-matched controls.



If CAC=0, it is reasonable to withhold statin therapy and reassess
in 5 to 10 years, as long as higher risk conditions are absent
(diabetes mellitus, family history of premature CHD in first degree
relatives (males <55 years; females <65 years), cigarette smoking,
or LDL >=190 mg/dL).

If CAC is 1 to 99, it is reasonable to initiate statin therapy for
patients >=55 years of age.

If CAC is >=100 or >=75th percentile, it is reasonable to initiate
statin therapy at any age.

Cardiology referral should be considered for patients with CAC
scores >=400 or >=75th percentile.

*3250 AHA/ACC/AACVPR/AAPA/ABC/PLANCIC/LIAKAT ALI/DWI HADI/Trigg/BOLIVAR/ANTHONY A/JIM
Guideline on the Management of Blood Cholesterol: A Report of the
American College of Cardiology/American Heart Association Task Force
on Clinical Practice Guidelines. J Am Coll Cardiol.
2308;73(24):2109-2290.

*** End of Addendum ***
EXAM:
OVER-READ INTERPRETATION  CT CHEST

The following report is an over-read performed by radiologist Dr.
Kaabachi Aamri [REDACTED] on 05/31/2021. This
over-read does not include interpretation of cardiac or coronary
anatomy or pathology. The coronary calcium score interpretation by
the cardiologist is attached.
FINDINGS: Aortic atherosclerosis. Within the visualized portions of the thorax
there are no suspicious appearing pulmonary nodules or masses, there
is no acute consolidative airspace disease, no pleural effusions, no
pneumothorax and no lymphadenopathy. Visualized portions of the
upper abdomen are unremarkable. There are no aggressive appearing
lytic or blastic lesions noted in the visualized portions of the
skeleton.
IMPRESSION: 1.  Aortic Atherosclerosis (AR79F-O1T.T).

## 2022-05-01 DIAGNOSIS — L821 Other seborrheic keratosis: Secondary | ICD-10-CM | POA: Diagnosis not present

## 2022-05-01 DIAGNOSIS — L814 Other melanin hyperpigmentation: Secondary | ICD-10-CM | POA: Diagnosis not present

## 2022-05-01 DIAGNOSIS — D225 Melanocytic nevi of trunk: Secondary | ICD-10-CM | POA: Diagnosis not present

## 2022-05-01 DIAGNOSIS — Z08 Encounter for follow-up examination after completed treatment for malignant neoplasm: Secondary | ICD-10-CM | POA: Diagnosis not present

## 2022-09-29 ENCOUNTER — Encounter: Payer: Self-pay | Admitting: Interventional Cardiology

## 2022-09-29 ENCOUNTER — Ambulatory Visit: Payer: BC Managed Care – PPO | Attending: Interventional Cardiology | Admitting: Interventional Cardiology

## 2022-09-29 VITALS — BP 130/72 | HR 66 | Ht 69.0 in | Wt 185.6 lb

## 2022-09-29 DIAGNOSIS — Z720 Tobacco use: Secondary | ICD-10-CM | POA: Diagnosis not present

## 2022-09-29 DIAGNOSIS — N1831 Chronic kidney disease, stage 3a: Secondary | ICD-10-CM | POA: Diagnosis not present

## 2022-09-29 DIAGNOSIS — Z8249 Family history of ischemic heart disease and other diseases of the circulatory system: Secondary | ICD-10-CM | POA: Diagnosis not present

## 2022-09-29 DIAGNOSIS — R7303 Prediabetes: Secondary | ICD-10-CM | POA: Diagnosis not present

## 2022-09-29 DIAGNOSIS — E782 Mixed hyperlipidemia: Secondary | ICD-10-CM | POA: Diagnosis not present

## 2022-09-29 NOTE — Progress Notes (Signed)
Cardiology Office Note   Date:  09/29/2022   ID:  Ernest [name], DOB Feb 06, 1961, MRN [id number]  PCP:  Josetta Huddle, MD    No chief complaint on file.  CAD  Wt Readings from Last 3 Encounters:  09/29/22 185 lb 9.6 oz (84.2 kg)  09/28/21 176 lb (79.8 kg)  07/18/21 174 lb 9.7 oz (79.2 kg)       History of Present Illness: Ernest [name] is a 62 y.o. male   who has had a strong family h/o CAD. His father died of an MI at age 57. His father was a heavy smoker. In 2014, he had some left sided chest discomfort that would come and go quickly. It was on and off for over a day.   He had an ETT and went 10 minutes without sx or ECG changes.    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  No other exercise.  He does  A lot of yard work still.  He gets over 10K steps as noted by his phone. He quit smoking in 7/18.   Normal ETT in 6/18.    In 3/19, LDL was 127 so atorvastatin was increased to 20 mg daily.  No side effects on the medicine.   In 2020, had cough induced syncope.    He had COVID in 8/21.  He got the antibody treatment and recovered. He now believes in the vaccine.  Quit smoking.   No COVID issues.   Calcium scoring test: "Coronary Calcium Score:   Left main: 1   Left anterior descending artery: 7   Left circumflex artery: 21   Right coronary artery: 259   Total: 288   Percentile: 84   Pericardium: Normal.   Aorta: Normal caliber of ascending aorta. No aortic atherosclerosis noted."  Job noted to be physically strenuous in Jan 2023.  Continues to be active at work.  Does a lot of walking.  Did stop smoking in 2023.     Past Medical History:  Diagnosis Date   History of colonoscopy 08/20/13   Hypertension    Mixed hyperlipidemia 01/21/2014    Past Surgical History:  Procedure Laterality Date   CHONDROPLASTY  07/18/2021   Procedure: CHONDROPLASTY;  Surgeon: Dorna Leitz, MD;  Location: [facility];  Service:  Orthopedics;;   COLONOSCOPY W/ POLYPECTOMY     COLONOSCOPY WITH PROPOFOL N/A 03/08/2015   Procedure: COLONOSCOPY WITH PROPOFOL;  Surgeon: Garlan Fair, MD;  Location: [facility];  Service: Endoscopy;  Laterality: N/A;   KNEE ARTHROSCOPY WITH MEDIAL MENISECTOMY Right 07/18/2021   Procedure: KNEE ARTHROSCOPY WITH MEDIAL MENISECTOMY;  Surgeon: Dorna Leitz, MD;  Location: [facility];  Service: Orthopedics;  Laterality: Right;   SHOULDER ARTHROSCOPY Right    tonselec     tonsillectomy   TONSILLECTOMY       Current Outpatient Medications  Medication Sig Dispense Refill   ALPRAZolam (XANAX) 0.25 MG tablet Take 1 tablet by mouth as needed for anxiety.  1   amLODipine-olmesartan (AZOR) 5-20 MG tablet Take 1 tablet by mouth daily.     aspirin 81 MG chewable tablet Chew 81 mg by mouth daily.      atorvastatin (LIPITOR) 20 MG tablet Take 20 mg by mouth daily.     buPROPion (WELLBUTRIN XL) 150 MG 24 hr tablet Take 450 mg by mouth every morning.     hydrochlorothiazide (HYDRODIURIL) 12.5 MG tablet Take 12.5 mg by mouth  2 (two) times daily.     Omega-3 Fatty Acids (FISH OIL) 1000 MG CAPS Take 2 capsules (2,000 mg total) by mouth 2 (two) times daily.  0   SYNTHROID 100 MCG tablet Take 100 mcg by mouth daily.     VIAGRA 100 MG tablet Take 100 mg by mouth daily as needed for erectile dysfunction.   6   zolpidem (AMBIEN) 5 MG tablet Take 5 mg by mouth at bedtime as needed for sleep.     ondansetron (ZOFRAN) 4 MG tablet Take 1 tablet (4 mg total) by mouth every 8 (eight) hours as needed for nausea or vomiting. (Patient not taking: Reported on 09/29/2022) 20 tablet 0   No current facility-administered medications for this visit.    Allergies:   Patient has no known allergies.    Social History:  The patient  reports that he has quit smoking. His smoking use included cigarettes. He has a 30.00 pack-year smoking history. He has never used smokeless tobacco. He reports that he does not  drink alcohol and does not use drugs.   Family History:  The patient's family history includes Heart attack in his father and paternal uncle; Heart disease in his father and paternal uncle.    ROS:  Please see the history of present illness.   Otherwise, review of systems are positive for able to stop smoking.   All other systems are reviewed and negative.    PHYSICAL EXAM: VS:  BP 130/72   Pulse 66   Ht 5\' 9"  (1.753 m)   Wt 185 lb 9.6 oz (84.2 kg)   SpO2 97%   BMI 27.41 kg/m  , BMI Body mass index is 27.41 kg/m. GEN: Well nourished, well developed, in no acute distress HEENT: normal Neck: no JVD, carotid bruits, or masses Cardiac: RRR; no murmurs, rubs, or gallops,no edema  Respiratory:  clear to auscultation bilaterally, normal work of breathing GI: soft, nontender, nondistended, + BS MS: no deformity or atrophy Skin: warm and dry, no rash Neuro:  Strength and sensation are intact Psych: euthymic mood, full affect   EKG:   The ekg ordered today demonstrates NSR, no ST segment changes   Recent Labs: No results found for requested labs within last 365 days.   Lipid Panel    Component Value Date/Time   CHOL 149 09/02/2021 0734   TRIG 111 09/02/2021 0734   HDL 39 (L) 09/02/2021 0734   CHOLHDL 3.8 09/02/2021 0734   LDLCALC 90 09/02/2021 0734     Other studies Reviewed: Additional studies/ records that were reviewed today with results demonstrating: labs reviewed .   ASSESSMENT AND PLAN:  Family h/o CAD: No change.  Whole food, plant based diet, High fiber diet.  Avoid processed foods.  Continue baby aspirin Hyperlipidemia: LDL 90.  Continue atorvastatin.  Tries to make healthy dietary choices.   Former smoker: He has stopped smoking.  CKD:  Cr 1.47 in 2022.  Needs f/u.  Avoiding nephrotoxins like NSAIDs. Stay well-hydrated. HTN: The current medical regimen is effective;  continue present plan and medications. Prediabetes: A1c 5.8 in December 2021.  Could consider  metformin if A1c increases.   Current medicines are reviewed at length with the patient today.  The patient concerns regarding his medicines were addressed.  The following changes have been made:  No change  Labs/ tests ordered today include:  No orders of the defined types were placed in this encounter.   Recommend 150 minutes/week of aerobic exercise Low fat,  low carb, high fiber diet recommended  Disposition:   FU in 1 year   Signed, Larae Grooms, MD  09/29/2022 10:42 AM    [facility] [address], [place], [place]  [address] Phone: [phone]; Fax: [phone]

## 2022-09-29 NOTE — Patient Instructions (Signed)
Medication Instructions:  Your physician recommends that you continue on your current medications as directed. Please refer to the Current Medication list given to you today.  *If you need a refill on your cardiac medications before your next appointment, please call your pharmacy*   Lab Work: Lab work to be done today--CBC, CMET, Lipids, A1C If you have labs (blood work) drawn today and your tests are completely normal, you will receive your results only by: [place] (if you have MyChart) OR A paper copy in the mail If you have any lab test that is abnormal or we need to change your treatment, we will call you to review the results.   Testing/Procedures: none   Follow-Up: At [facility], you and your health needs are our priority.  As part of our continuing mission to provide you with exceptional heart care, we have created designated Provider Care Teams.  These Care Teams include your primary Cardiologist (physician) and Advanced Practice Providers (APPs -  Physician Assistants and Nurse Practitioners) who all work together to provide you with the care you need, when you need it.  We recommend signing up for the patient portal called "MyChart".  Sign up information is provided on this After Visit Summary.  MyChart is used to connect with patients for Virtual Visits (Telemedicine).  Patients are able to view lab/test results, encounter notes, upcoming appointments, etc.  Non-urgent messages can be sent to your provider as well.   To learn more about what you can do with MyChart, go to [email or web address].    Your next appointment:   12 month(s)  The format for your next appointment:   In Person  Provider:   Larae Grooms, MD     Other Instructions    Important Information About Sugar

## 2022-09-30 LAB — CBC
Hematocrit: 47 % (ref 37.5–51.0)
Hemoglobin: 15.6 g/dL (ref 13.0–17.7)
MCH: 29.2 pg (ref 26.6–33.0)
MCHC: 33.2 g/dL (ref 31.5–35.7)
MCV: 88 fL (ref 79–97)
Platelets: 226 10*3/uL (ref 150–450)
RBC: 5.34 x10E6/uL (ref 4.14–5.80)
RDW: 12.9 % (ref 11.6–15.4)
WBC: 5.9 10*3/uL (ref 3.4–10.8)

## 2022-09-30 LAB — LIPID PANEL
Chol/HDL Ratio: 3.6 ratio (ref 0.0–5.0)
Cholesterol, Total: 168 mg/dL (ref 100–199)
HDL: 47 mg/dL (ref >39)
LDL Chol Calc (NIH): 99 mg/dL (ref 0–99)
Triglycerides: 125 mg/dL (ref 0–149)
VLDL Cholesterol Cal: 22 mg/dL (ref 5–40)

## 2022-09-30 LAB — COMPREHENSIVE METABOLIC PANEL
ALT: 28 IU/L (ref 0–44)
AST: 23 IU/L (ref 0–40)
Albumin/Globulin Ratio: 2 (ref 1.2–2.2)
Albumin: 4.7 g/dL (ref 3.9–4.9)
Alkaline Phosphatase: 102 IU/L (ref 44–121)
BUN/Creatinine Ratio: 11 (ref 10–24)
BUN: 15 mg/dL (ref 8–27)
Bilirubin Total: 0.5 mg/dL (ref 0.0–1.2)
CO2: 24 mmol/L (ref 20–29)
Calcium: 9.5 mg/dL (ref 8.6–10.2)
Chloride: 104 mmol/L (ref 96–106)
Creatinine, Ser: 1.39 mg/dL — ABNORMAL HIGH (ref 0.76–1.27)
Globulin, Total: 2.4 g/dL (ref 1.5–4.5)
Glucose: 81 mg/dL (ref 70–99)
Potassium: 4.2 mmol/L (ref 3.5–5.2)
Sodium: 142 mmol/L (ref 134–144)
Total Protein: 7.1 g/dL (ref 6.0–8.5)
eGFR: 57 mL/min/{1.73_m2} — ABNORMAL LOW (ref >59)

## 2022-09-30 LAB — HEMOGLOBIN A1C
Est. average glucose Bld gHb Est-mCnc: 123 mg/dL
Hgb A1c MFr Bld: 5.9 % — ABNORMAL HIGH (ref 4.8–5.6)

## 2022-11-01 DIAGNOSIS — Z125 Encounter for screening for malignant neoplasm of prostate: Secondary | ICD-10-CM | POA: Diagnosis not present

## 2022-11-01 DIAGNOSIS — E559 Vitamin D deficiency, unspecified: Secondary | ICD-10-CM | POA: Diagnosis not present

## 2022-11-01 DIAGNOSIS — E538 Deficiency of other specified B group vitamins: Secondary | ICD-10-CM | POA: Diagnosis not present

## 2022-11-01 DIAGNOSIS — I10 Essential (primary) hypertension: Secondary | ICD-10-CM | POA: Diagnosis not present

## 2022-11-01 DIAGNOSIS — E039 Hypothyroidism, unspecified: Secondary | ICD-10-CM | POA: Diagnosis not present

## 2022-11-01 DIAGNOSIS — Z23 Encounter for immunization: Secondary | ICD-10-CM | POA: Diagnosis not present

## 2022-11-01 DIAGNOSIS — E7800 Pure hypercholesterolemia, unspecified: Secondary | ICD-10-CM | POA: Diagnosis not present

## 2022-11-02 ENCOUNTER — Other Ambulatory Visit: Payer: Self-pay | Admitting: Internal Medicine

## 2022-11-02 DIAGNOSIS — Z720 Tobacco use: Secondary | ICD-10-CM

## 2022-11-15 DIAGNOSIS — R051 Acute cough: Secondary | ICD-10-CM | POA: Diagnosis not present

## 2022-11-15 DIAGNOSIS — U071 COVID-19: Secondary | ICD-10-CM | POA: Diagnosis not present

## 2022-11-20 ENCOUNTER — Ambulatory Visit: Payer: BC Managed Care – PPO | Admitting: Interventional Cardiology

## 2022-11-30 ENCOUNTER — Ambulatory Visit
Admission: RE | Admit: 2022-11-30 | Discharge: 2022-11-30 | Disposition: A | Discharge status: Home / Self Care | Payer: BC Managed Care – PPO | Source: Ambulatory Visit | Attending: Internal Medicine | Admitting: Internal Medicine

## 2022-11-30 DIAGNOSIS — I700 Atherosclerosis of aorta: Secondary | ICD-10-CM | POA: Diagnosis not present

## 2022-11-30 DIAGNOSIS — J432 Centrilobular emphysema: Secondary | ICD-10-CM | POA: Diagnosis not present

## 2022-11-30 DIAGNOSIS — I2510 Atherosclerotic heart disease of native coronary artery without angina pectoris: Secondary | ICD-10-CM | POA: Diagnosis not present

## 2022-11-30 DIAGNOSIS — Z87891 Personal history of nicotine dependence: Secondary | ICD-10-CM | POA: Diagnosis not present

## 2022-11-30 DIAGNOSIS — Z720 Tobacco use: Secondary | ICD-10-CM

## 2022-12-11 DIAGNOSIS — R972 Elevated prostate specific antigen [PSA]: Secondary | ICD-10-CM | POA: Diagnosis not present

## 2023-02-02 DIAGNOSIS — Z23 Encounter for immunization: Secondary | ICD-10-CM | POA: Diagnosis not present

## 2023-05-24 DIAGNOSIS — L570 Actinic keratosis: Secondary | ICD-10-CM | POA: Diagnosis not present

## 2023-05-24 DIAGNOSIS — L821 Other seborrheic keratosis: Secondary | ICD-10-CM | POA: Diagnosis not present

## 2023-05-24 DIAGNOSIS — L814 Other melanin hyperpigmentation: Secondary | ICD-10-CM | POA: Diagnosis not present

## 2023-05-24 DIAGNOSIS — D225 Melanocytic nevi of trunk: Secondary | ICD-10-CM | POA: Diagnosis not present

## 2023-07-10 DIAGNOSIS — L814 Other melanin hyperpigmentation: Secondary | ICD-10-CM | POA: Diagnosis not present

## 2023-07-10 DIAGNOSIS — L578 Other skin changes due to chronic exposure to nonionizing radiation: Secondary | ICD-10-CM | POA: Diagnosis not present

## 2023-07-10 DIAGNOSIS — L821 Other seborrheic keratosis: Secondary | ICD-10-CM | POA: Diagnosis not present

## 2023-08-29 ENCOUNTER — Ambulatory Visit: Payer: Self-pay

## 2023-08-29 ENCOUNTER — Other Ambulatory Visit: Payer: Self-pay | Admitting: Family Medicine

## 2023-08-29 DIAGNOSIS — M25552 Pain in left hip: Secondary | ICD-10-CM
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
## 2023-09-24 ENCOUNTER — Ambulatory Visit: Payer: BC Managed Care – PPO | Attending: Nurse Practitioner | Admitting: Nurse Practitioner

## 2023-09-24 ENCOUNTER — Encounter: Payer: Self-pay | Admitting: Nurse Practitioner

## 2023-09-24 VITALS — BP 116/74 | HR 94 | Ht 70.0 in | Wt 188.4 lb

## 2023-09-24 DIAGNOSIS — E782 Mixed hyperlipidemia: Secondary | ICD-10-CM | POA: Diagnosis not present

## 2023-09-24 DIAGNOSIS — I10 Essential (primary) hypertension: Secondary | ICD-10-CM | POA: Diagnosis not present

## 2023-09-24 DIAGNOSIS — Z8249 Family history of ischemic heart disease and other diseases of the circulatory system: Secondary | ICD-10-CM | POA: Diagnosis not present

## 2023-09-24 DIAGNOSIS — N1831 Chronic kidney disease, stage 3a: Secondary | ICD-10-CM

## 2023-09-24 DIAGNOSIS — Z720 Tobacco use: Secondary | ICD-10-CM

## 2023-09-24 DIAGNOSIS — I4510 Unspecified right bundle-branch block: Secondary | ICD-10-CM

## 2023-09-24 NOTE — Patient Instructions (Signed)
Medication Instructions:   Your physician recommends that you continue on your current medications as directed. Please refer to the Current Medication list given to you today.   *If you need a refill on your cardiac medications before your next appointment, please call your pharmacy*   Lab Work:  None ordered  If you have labs (blood work) drawn today and your tests are completely normal, you will receive your results only by: MyChart Message (if you have MyChart) OR A paper copy in the mail If you have any lab test that is abnormal or we need to change your treatment, we will call you to review the results.   Testing/Procedures:  Your physician has requested that you have an echocardiogram. Echocardiography is a painless test that uses sound waves to create images of your heart. It provides your doctor with information about the size and shape of your heart and how well your heart's chambers and valves are working. This procedure takes approximately one hour. There are no restrictions for this procedure. Please do NOT wear cologne, aftershave, or lotions (deodorant is allowed). Please arrive 15 minutes prior to your appointment time.  You are scheduled for a Myocardial Perfusion Imaging Study on Monday, January 27 at 7:15 am.   Please arrive 15 minutes prior to your appointment time for registration and insurance purposes.   The test will take approximately 3 to 4 hours to complete; you may bring reading material. If someone comes with you to your appointment, they will need to remain in the main lobby due to limited space in the testing area.   How to prepare for your Myocardial Perfusion test:   Do not eat or drink 3 hours prior to your test, except you may have water.    Do not consume products containing caffeine (regular or decaffeinated) 12 hours prior to your test (ex: coffee, chocolate, soda, tea)   Do bring a list of your current medications with you. If not listed  below, you may take your medications as normal.   Bring any held medication to your appointment, as you may be required to take it once the test is complete.   Do wear comfortable clothes (no overalls) and walking shoes. Tennis shoes are preferred. No open toed shoes.  Do not wear cologne, perfume, aftershave or lotions (deodorant is allowed).   If these instructions are not followed, you test will have to be rescheduled.   Please report to [address] for your test. If you have questions or concerns about your appointment, please call the Nuclear Lab at #[phone].  If you cannot keep your appointment, please provide 24 hour notification to the Nuclear lab to avoid a possible $50 charge to your account.          Follow-Up: At [facility], you and your health needs are our priority.  As part of our continuing mission to provide you with exceptional heart care, we have created designated Provider Care Teams.  These Care Teams include your primary Cardiologist (physician) and Advanced Practice Providers (APPs -  Physician Assistants and Nurse Practitioners) who all work together to provide you with the care you need, when you need it.  We recommend signing up for the patient portal called "MyChart".  Sign up information is provided on this After Visit Summary.  MyChart is used to connect with patients for Virtual Visits (Telemedicine).  Patients are able to view lab/test results, encounter notes, upcoming appointments, etc.  Non-urgent  messages can be sent to your provider as well.   To learn more about what you can do with MyChart, go to [email or web address].    Your next appointment:   6 month(s)  Provider:   Donato Schultz, MD  Other Instructions  Check you bp and heart rate and record.   DASH Eating Plan DASH stands for Dietary Approaches to Stop Hypertension. The DASH eating plan is a healthy eating plan that has been shown to: Lower high  blood pressure (hypertension). Reduce your risk for type 2 diabetes, heart disease, and stroke. Help with weight loss. What are tips for following this plan? Reading food labels Check food labels for the amount of salt (sodium) per serving. Choose foods with less than 5 percent of the Daily Value (DV) of sodium. In general, foods with less than 300 milligrams (mg) of sodium per serving fit into this eating plan. To find whole grains, look for the word "whole" as the first word in the ingredient list. Shopping Buy products labeled as "low-sodium" or "no salt added." Buy fresh foods. Avoid canned foods and pre-made or frozen meals. Cooking Try not to add salt when you cook. Use salt-free seasonings or herbs instead of table salt or sea salt. Check with your health care provider or pharmacist before using salt substitutes. Do not fry foods. Cook foods in healthy ways, such as baking, boiling, grilling, roasting, or broiling. Cook using oils that are good for your heart. These include olive, canola, avocado, soybean, and sunflower oil. Meal planning  Eat a balanced diet. This should include: 4 or more servings of fruits and 4 or more servings of vegetables each day. Try to fill half of your plate with fruits and vegetables. 6-8 servings of whole grains each day. 6 or less servings of lean meat, poultry, or fish each day. 1 oz is 1 serving. A 3 oz (85 g) serving of meat is about the same size as the palm of your hand. One egg is 1 oz (28 g). 2-3 servings of low-fat dairy each day. One serving is 1 cup (237 mL). 1 serving of nuts, seeds, or beans 5 times each week. 2-3 servings of heart-healthy fats. Healthy fats called omega-3 fatty acids are found in foods such as walnuts, flaxseeds, fortified milks, and eggs. These fats are also found in cold-water fish, such as sardines, salmon, and mackerel. Limit how much you eat of: Canned or prepackaged foods. Food that is high in trans fat, such as fried  foods. Food that is high in saturated fat, such as fatty meat. Desserts and other sweets, sugary drinks, and other foods with added sugar. Full-fat dairy products. Do not salt foods before eating. Do not eat more than 4 egg yolks a week. Try to eat at least 2 vegetarian meals a week. Eat more home-cooked food and less restaurant, buffet, and fast food. Lifestyle When eating at a restaurant, ask if your food can be made with less salt or no salt. If you drink alcohol: Limit how much you have to: 0-1 drink a day if you are male. 0-2 drinks a day if you are male. Know how much alcohol is in your drink. In the [place] one drink is one 12 oz bottle of beer (355 mL), one 5 oz glass of wine (148 mL), or one 1 oz glass of hard liquor (44 mL). General information Avoid eating more than 2,300 mg of salt a day. If you have hypertension, you may need to reduce your  sodium intake to 1,500 mg a day. Work with your provider to stay at a healthy body weight or lose weight. Ask what the best weight range is for you. On most days of the week, get at least 30 minutes of exercise that causes your heart to beat faster. This may include walking, swimming, or biking. Work with your provider or dietitian to adjust your eating plan to meet your specific calorie needs. What foods should I eat? Fruits All fresh, dried, or frozen fruit. Canned fruits that are in their natural juice and do not have sugar added to them. Vegetables Fresh or frozen vegetables that are raw, steamed, roasted, or grilled. Low-sodium or reduced-sodium tomato and vegetable juice. Low-sodium or reduced-sodium tomato sauce and tomato paste. Low-sodium or reduced-sodium canned vegetables. Grains Whole-grain or whole-wheat bread. Whole-grain or whole-wheat pasta. Brown rice. Orpah Cobb. Bulgur. Whole-grain and low-sodium cereals. Pita bread. Low-fat, low-sodium crackers. Whole-wheat flour tortillas. Meats and other proteins Skinless  chicken or [place]. Ground chicken or [place]. Pork with fat trimmed off. Fish and seafood. Egg whites. Dried beans, peas, or lentils. Unsalted nuts, nut butters, and seeds. Unsalted canned beans. Lean cuts of beef with fat trimmed off. Low-sodium, lean precooked or cured meat, such as sausages or meat loaves. Dairy Low-fat (1%) or fat-free (skim) milk. Reduced-fat, low-fat, or fat-free cheeses. Nonfat, low-sodium ricotta or cottage cheese. Low-fat or nonfat yogurt. Low-fat, low-sodium cheese. Fats and oils Soft margarine without trans fats. Vegetable oil. Reduced-fat, low-fat, or light mayonnaise and salad dressings (reduced-sodium). Canola, safflower, olive, avocado, soybean, and sunflower oils. Avocado. Seasonings and condiments Herbs. Spices. Seasoning mixes without salt. Other foods Unsalted popcorn and pretzels. Fat-free sweets. The items listed above may not be all the foods and drinks you can have. Talk to a dietitian to learn more. What foods should I avoid? Fruits Canned fruit in a light or heavy syrup. Fried fruit. Fruit in cream or butter sauce. Vegetables Creamed or fried vegetables. Vegetables in a cheese sauce. Regular canned vegetables that are not marked as low-sodium or reduced-sodium. Regular canned tomato sauce and paste that are not marked as low-sodium or reduced-sodium. Regular tomato and vegetable juices that are not marked as low-sodium or reduced-sodium. Rosita Fire. Olives. Grains Baked goods made with fat, such as croissants, muffins, or some breads. Dry pasta or rice meal packs. Meats and other proteins Fatty cuts of meat. Ribs. Fried meat. Tomasa Blase. Bologna, salami, and other precooked or cured meats, such as sausages or meat loaves, that are not lean and low in sodium. Fat from the back of a pig (fatback). Bratwurst. Salted nuts and seeds. Canned beans with added salt. Canned or smoked fish. Whole eggs or egg yolks. Chicken or [place] with skin. Dairy Whole or 2% milk, cream,  and half-and-half. Whole or full-fat cream cheese. Whole-fat or sweetened yogurt. Full-fat cheese. Nondairy creamers. Whipped toppings. Processed cheese and cheese spreads. Fats and oils Butter. Stick margarine. Lard. Shortening. Ghee. Bacon fat. Tropical oils, such as coconut, palm kernel, or palm oil. Seasonings and condiments Onion salt, garlic salt, seasoned salt, table salt, and sea salt. Worcestershire sauce. Tartar sauce. Barbecue sauce. Teriyaki sauce. Soy sauce, including reduced-sodium soy sauce. Steak sauce. Canned and packaged gravies. Fish sauce. Oyster sauce. Cocktail sauce. Store-bought horseradish. Ketchup. Mustard. Meat flavorings and tenderizers. Bouillon cubes. Hot sauces. Pre-made or packaged marinades. Pre-made or packaged taco seasonings. Relishes. Regular salad dressings. Other foods Salted popcorn and pretzels. The items listed above may not be all the foods and drinks you  should avoid. Talk to a dietitian to learn more. Where to find more information National Heart, Lung, and Blood Institute (NHLBI): BuffaloDryCleaner.gl American Heart Association (AHA): heart.org Academy of Nutrition and Dietetics: eatright.org National Kidney Foundation (NKF): kidney.org This information is not intended to replace advice given to you by your health care provider. Make sure you discuss any questions you have with your health care provider. Document Revised: 09/28/2022 Document Reviewed: 09/28/2022 Elsevier Patient Education  2024 [employer].

## 2023-10-15 ENCOUNTER — Telehealth (HOSPITAL_COMMUNITY): Payer: Self-pay | Admitting: *Deleted

## 2023-10-15 NOTE — Telephone Encounter (Signed)
Left message on voicemail per DPR in reference to upcoming appointment scheduled on 10/22/2023 at 7:15 with detailed instructions given per Myocardial Perfusion Study Information Sheet for the test. LM to arrive 15 minutes early, and that it is imperative to arrive on time for appointment to keep from having the test rescheduled. If you need to cancel or reschedule your appointment, please call the office within 24 hours of your appointment. Failure to do so may result in a cancellation of your appointment, and a $50 no show fee. Phone number given for call back for any questions.

## 2023-10-17 ENCOUNTER — Other Ambulatory Visit (HOSPITAL_COMMUNITY): Payer: BC Managed Care – PPO

## 2023-10-22 ENCOUNTER — Ambulatory Visit (HOSPITAL_COMMUNITY): Payer: BC Managed Care – PPO | Attending: Cardiology

## 2023-10-22 ENCOUNTER — Ambulatory Visit (HOSPITAL_BASED_OUTPATIENT_CLINIC_OR_DEPARTMENT_OTHER): Payer: BC Managed Care – PPO

## 2023-10-22 DIAGNOSIS — I129 Hypertensive chronic kidney disease with stage 1 through stage 4 chronic kidney disease, or unspecified chronic kidney disease: Secondary | ICD-10-CM | POA: Insufficient documentation

## 2023-10-22 DIAGNOSIS — Z720 Tobacco use: Secondary | ICD-10-CM

## 2023-10-22 DIAGNOSIS — Z8249 Family history of ischemic heart disease and other diseases of the circulatory system: Secondary | ICD-10-CM

## 2023-10-22 DIAGNOSIS — E782 Mixed hyperlipidemia: Secondary | ICD-10-CM

## 2023-10-22 DIAGNOSIS — I10 Essential (primary) hypertension: Secondary | ICD-10-CM | POA: Insufficient documentation

## 2023-10-22 DIAGNOSIS — N1831 Chronic kidney disease, stage 3a: Secondary | ICD-10-CM | POA: Insufficient documentation

## 2023-10-22 DIAGNOSIS — I4510 Unspecified right bundle-branch block: Secondary | ICD-10-CM

## 2023-10-22 DIAGNOSIS — F17210 Nicotine dependence, cigarettes, uncomplicated: Secondary | ICD-10-CM | POA: Insufficient documentation

## 2023-10-22 LAB — MYOCARDIAL PERFUSION IMAGING
LV dias vol: 64 mL (ref 62–150)
LV sys vol: 27 mL
Nuc Stress EF: 59 %
Peak HR: 92 {beats}/min
Rest HR: 81 {beats}/min
Rest Nuclear Isotope Dose: 10.9 mCi
SDS: 0
SRS: 0
SSS: 0
ST Depression (mm): 0 mm
Stress Nuclear Isotope Dose: 32.3 mCi
TID: 1.12

## 2023-10-22 LAB — ECHOCARDIOGRAM COMPLETE
Area-P 1/2: 3.31 cm2
Height: 70 in
S' Lateral: 3.21 cm
Weight: 3008 [oz_av]

## 2023-10-22 MED ORDER — TECHNETIUM TC 99M TETROFOSMIN IV KIT
32.3000 | PACK | Freq: Once | INTRAVENOUS | Status: AC | PRN
  Administered 2023-10-22: 32.3 via INTRAVENOUS

## 2023-10-22 MED ORDER — TECHNETIUM TC 99M TETROFOSMIN IV KIT
10.9000 | PACK | Freq: Once | INTRAVENOUS | Status: AC | PRN
  Administered 2023-10-22: 10.9 via INTRAVENOUS

## 2023-10-22 MED ORDER — REGADENOSON 0.4 MG/5ML IV SOLN
0.4000 mg | Freq: Once | INTRAVENOUS | Status: AC
  Administered 2023-10-22: 0.4 mg via INTRAVENOUS

## 2023-11-05 DIAGNOSIS — E538 Deficiency of other specified B group vitamins: Secondary | ICD-10-CM | POA: Diagnosis not present

## 2023-11-05 DIAGNOSIS — I10 Essential (primary) hypertension: Secondary | ICD-10-CM | POA: Diagnosis not present

## 2023-11-05 DIAGNOSIS — Z0000 Encounter for general adult medical examination without abnormal findings: Secondary | ICD-10-CM | POA: Diagnosis not present

## 2023-11-05 DIAGNOSIS — E7800 Pure hypercholesterolemia, unspecified: Secondary | ICD-10-CM | POA: Diagnosis not present

## 2023-11-05 DIAGNOSIS — E559 Vitamin D deficiency, unspecified: Secondary | ICD-10-CM | POA: Diagnosis not present

## 2023-11-05 DIAGNOSIS — E039 Hypothyroidism, unspecified: Secondary | ICD-10-CM | POA: Diagnosis not present

## 2023-11-05 DIAGNOSIS — R7303 Prediabetes: Secondary | ICD-10-CM | POA: Diagnosis not present

## 2023-11-05 DIAGNOSIS — Z79899 Other long term (current) drug therapy: Secondary | ICD-10-CM | POA: Diagnosis not present

## 2023-11-05 DIAGNOSIS — N1831 Chronic kidney disease, stage 3a: Secondary | ICD-10-CM | POA: Diagnosis not present

## 2023-11-06 ENCOUNTER — Other Ambulatory Visit: Payer: Self-pay | Admitting: Internal Medicine

## 2023-11-06 ENCOUNTER — Other Ambulatory Visit (HOSPITAL_COMMUNITY): Payer: Self-pay | Admitting: Radiology

## 2023-11-06 ENCOUNTER — Other Ambulatory Visit (HOSPITAL_COMMUNITY): Payer: Self-pay | Admitting: Internal Medicine

## 2023-11-06 DIAGNOSIS — Z122 Encounter for screening for malignant neoplasm of respiratory organs: Secondary | ICD-10-CM

## 2023-11-06 DIAGNOSIS — J42 Unspecified chronic bronchitis: Secondary | ICD-10-CM

## 2023-11-23 ENCOUNTER — Ambulatory Visit (HOSPITAL_COMMUNITY): Payer: BC Managed Care – PPO

## 2023-11-26 ENCOUNTER — Encounter (HOSPITAL_COMMUNITY): Payer: Self-pay

## 2023-11-26 ENCOUNTER — Ambulatory Visit (HOSPITAL_COMMUNITY)
Admission: RE | Admit: 2023-11-26 | Discharge: 2023-11-26 | Disposition: A | Discharge status: Home / Self Care | Payer: BC Managed Care – PPO | Source: Ambulatory Visit | Attending: Internal Medicine | Admitting: Internal Medicine

## 2023-11-26 DIAGNOSIS — Z122 Encounter for screening for malignant neoplasm of respiratory organs: Secondary | ICD-10-CM | POA: Insufficient documentation

## 2023-11-26 DIAGNOSIS — F17210 Nicotine dependence, cigarettes, uncomplicated: Secondary | ICD-10-CM | POA: Diagnosis not present

## 2023-12-06 ENCOUNTER — Ambulatory Visit (HOSPITAL_COMMUNITY)
Admission: RE | Admit: 2023-12-06 | Discharge: 2023-12-06 | Disposition: A | Discharge status: Home / Self Care | Source: Ambulatory Visit | Attending: Internal Medicine | Admitting: Internal Medicine

## 2023-12-06 DIAGNOSIS — J42 Unspecified chronic bronchitis: Secondary | ICD-10-CM | POA: Insufficient documentation

## 2023-12-06 LAB — PULMONARY FUNCTION TEST
DL/VA % pred: 76 %
DL/VA: 3.18 ml/min/mmHg/L
DLCO unc % pred: 74 %
DLCO unc: 20.14 ml/min/mmHg
FEF 25-75 Post: 1.33 L/s
FEF 25-75 Pre: 1.03 L/s
FEF2575-%Change-Post: 28 %
FEF2575-%Pred-Post: 46 %
FEF2575-%Pred-Pre: 36 %
FEV1-%Change-Post: 8 %
FEV1-%Pred-Post: 64 %
FEV1-%Pred-Pre: 59 %
FEV1-Post: 2.25 L
FEV1-Pre: 2.07 L
FEV1FVC-%Change-Post: -2 %
FEV1FVC-%Pred-Pre: 76 %
FEV6-%Change-Post: 12 %
FEV6-%Pred-Post: 87 %
FEV6-%Pred-Pre: 77 %
FEV6-Post: 3.88 L
FEV6-Pre: 3.46 L
FEV6FVC-%Change-Post: 0 %
FEV6FVC-%Pred-Post: 100 %
FEV6FVC-%Pred-Pre: 100 %
FVC-%Change-Post: 12 %
FVC-%Pred-Post: 86 %
FVC-%Pred-Pre: 77 %
FVC-Post: 4.05 L
FVC-Pre: 3.61 L
Post FEV1/FVC ratio: 56 %
Post FEV6/FVC ratio: 96 %
Pre FEV1/FVC ratio: 57 %
Pre FEV6/FVC Ratio: 96 %
RV % pred: 174 %
RV: 4.02 L
TLC % pred: 115 %
TLC: 8.11 L

## 2023-12-06 MED ORDER — ALBUTEROL SULFATE (2.5 MG/3ML) 0.083% IN NEBU
2.5000 mg | INHALATION_SOLUTION | Freq: Once | RESPIRATORY_TRACT | Status: AC
  Administered 2023-12-06: 2.5 mg via RESPIRATORY_TRACT

## 2023-12-07 ENCOUNTER — Encounter (HOSPITAL_COMMUNITY)

## 2023-12-18 DIAGNOSIS — E781 Pure hyperglyceridemia: Secondary | ICD-10-CM | POA: Diagnosis not present

## 2024-03-13 ENCOUNTER — Other Ambulatory Visit: Payer: Self-pay

## 2024-03-13 ENCOUNTER — Encounter: Payer: Self-pay | Admitting: Cardiology

## 2024-03-13 ENCOUNTER — Ambulatory Visit: Payer: BC Managed Care – PPO | Attending: Cardiology | Admitting: Cardiology

## 2024-03-13 VITALS — BP 118/82 | HR 80 | Ht 70.0 in | Wt 190.2 lb

## 2024-03-13 DIAGNOSIS — I2510 Atherosclerotic heart disease of native coronary artery without angina pectoris: Secondary | ICD-10-CM

## 2024-03-13 DIAGNOSIS — I4510 Unspecified right bundle-branch block: Secondary | ICD-10-CM

## 2024-03-13 DIAGNOSIS — N1831 Chronic kidney disease, stage 3a: Secondary | ICD-10-CM | POA: Diagnosis not present

## 2024-03-13 DIAGNOSIS — E782 Mixed hyperlipidemia: Secondary | ICD-10-CM

## 2024-03-13 DIAGNOSIS — Z8249 Family history of ischemic heart disease and other diseases of the circulatory system: Secondary | ICD-10-CM

## 2024-03-13 MED ORDER — ROSUVASTATIN CALCIUM 20 MG PO TABS: 20.0000 mg | ORAL_TABLET | Freq: Every day | ORAL | 3 refills | Status: AC

## 2024-03-13 NOTE — Progress Notes (Signed)
 Cardiology Office Note:  .   Date:  03/13/2024  ID:  Ernest [name], DOB August 04, 1961, MRN [id number] PCP: Benedetta Bradley, MD  [facility] Providers Cardiologist:  Dorothye Gathers, MD    History of Present Illness: .   Ernest [name] is a 63 y.o. male Discussed the use of AI scribe software for clinical note transcription with the patient, who gave verbal consent to proceed.  History of Present Illness Ernest [name] is a 63 year old male with coronary artery disease who presents for a follow-up visit.  He has a history of coronary artery disease with a significant family history, as his father died of a myocardial infarction at age 64. He previously underwent an exercise treadmill test, completing it for ten minutes without any changes. His coronary calcium  score was 288, placing him in the 84th percentile. A nuclear stress test on October 22, 2023, revealed a small area of ischemia in the mid inferior portion of the heart. An echocardiogram in January 2025 showed normal pump function with an ejection fraction of 55 to 60 percent. A new right bundle branch block was noted at his last office visit.  He is a former smoker and has a history of prediabetes with an A1C of 5.8% in December 2021. His creatinine level is 1.5, indicating possible chronic kidney disease. He is currently taking amlodipine and olmesartan 5/20 mg daily, atorvastatin  20 mg daily, hydrochlorothiazide 12.5 mg twice a day, and fish oil . He also takes a low dose aspirin.  His LDL cholesterol was last checked in March and was 91, which is higher than the target of less than 70. No issues with his current atorvastatin  regimen, which he has been taking for a long time.    Works for [employer]  ROS: No CP  Studies Reviewed: .        Results LABS Creatinine: 1.5 HbA1c: 5.8 (08/2020) LDL: 97 (11/2023)  RADIOLOGY Coronary calcium  score: 288, 84th percentile  DIAGNOSTIC Nuclear stress test: small area of  ischemia in the mid inferior portion, overall low risk (10/22/2023) Echocardiogram: Ejection fraction 55-60%, normal pump function (09/2023) Risk Assessment/Calculations:            Physical Exam:   VS:  BP 118/82 (BP Location: Left Arm, Patient Position: Sitting, Cuff Size: Normal)   Pulse 80   Ht 5' 10 (1.778 m)   Wt 190 lb 3.2 oz (86.3 kg)   SpO2 98%   BMI 27.29 kg/m    Wt Readings from Last 3 Encounters:  03/13/24 190 lb 3.2 oz (86.3 kg)  10/22/23 188 lb (85.3 kg)  09/24/23 188 lb 6.4 oz (85.5 kg)    GEN: Well nourished, well developed in no acute distress NECK: No JVD; No carotid bruits CARDIAC: RRR, no murmurs, no rubs, no gallops RESPIRATORY:  Clear to auscultation without rales, wheezing or rhonchi  ABDOMEN: Soft, non-tender, non-distended EXTREMITIES:  No edema; No deformity   ASSESSMENT AND PLAN: .    Assessment and Plan Assessment & Plan Nonobstructive coronary artery disease Nonobstructive coronary artery disease with a small area of ischemia in the mid inferior portion of the heart, as shown in the nuclear stress test on October 22, 2023. Overall low risk. Echocardiogram in January 2025 showed normal pump function with an EF of 55-60%. Current LDL is 97, above the target of less than 70 for coronary disease. Discussed switching from atorvastatin  20 mg to rosuvastatin 20 mg to achieve LDL goal. Rosuvastatin is a more  potent statin that may help reduce LDL levels further. Discussed benefits of reducing LDL to less than 70, associated with reduced risk of future myocardial infarctions. He prefers not to add another medication, so agreed to switch to rosuvastatin. - Switch atorvastatin  20 mg to rosuvastatin 20 mg daily. - Recheck lipid panel in 3 months. - Continue low-dose aspirin.  Right bundle branch block New right bundle branch block noted at the last office visit.  Pre-diabetes Pre-diabetes with an A1C of 5.8% as of December 2021. No specific changes in  management during this visit.  Follow-up Follow-up plan contingent on achieving LDL goals. If LDL is reduced to less than 70, follow-up with primary care physician for ongoing management of nonobstructive coronary disease. - PRN follow-up with cardiology if LDL goals are not met or if primary care physician recommends. - Change primary care physician to Charlene Conners in records.  I am comfortable with Dr. Teofilo Fellers refilling his medications once we are at goal for his LDL.  He is stable to graduate from the cardiology clinic and maintain follow-up with excellent primary care.  Of course if we are needed do not be hesitant to reach out and we will get him back in.           Signed, Dorothye Gathers, MD

## 2024-03-13 NOTE — Patient Instructions (Signed)
 Medication Instructions:  DISCONTINUE Lipitor  START Crestor 20 mg daily   CONTINUE all other medications as listed *If you need a refill on your cardiac medications before your next appointment, please call your pharmacy*  Lab Work: LIPID PANEL in 3 months  If you have labs (blood work) drawn today and your tests are completely normal, you will receive your results only by: MyChart Message (if you have MyChart) OR A paper copy in the mail If you have any lab test that is abnormal or we need to change your treatment, we will call you to review the results.   Follow-Up: At [facility], you and your health needs are our priority.  As part of our continuing mission to provide you with exceptional heart care, our providers are all part of one team.  This team includes your primary Cardiologist (physician) and Advanced Practice Providers or APPs (Physician Assistants and Nurse Practitioners) who all work together to provide you with the care you need, when you need it.  Your next appointment:   Follow up as needed with Dr. Renna Cary  We recommend signing up for the patient portal called MyChart.  Sign up information is provided on this After Visit Summary.  MyChart is used to connect with patients for Virtual Visits (Telemedicine).  Patients are able to view lab/test results, encounter notes, upcoming appointments, etc.  Non-urgent messages can be sent to your provider as well.   To learn more about what you can do with MyChart, go to [email or web address].

## 2024-04-22 DIAGNOSIS — M2012 Hallux valgus (acquired), left foot: Secondary | ICD-10-CM | POA: Diagnosis not present

## 2024-04-22 DIAGNOSIS — M21622 Bunionette of left foot: Secondary | ICD-10-CM | POA: Diagnosis not present

## 2024-05-11 ENCOUNTER — Other Ambulatory Visit: Payer: Self-pay | Admitting: Medical Genetics

## 2024-05-20 ENCOUNTER — Other Ambulatory Visit: Payer: Self-pay | Admitting: Physician Assistant

## 2024-05-20 ENCOUNTER — Ambulatory Visit
Admission: RE | Admit: 2024-05-20 | Discharge: 2024-05-20 | Disposition: A | Discharge status: Home / Self Care | Source: Ambulatory Visit | Attending: Physician Assistant | Admitting: Physician Assistant

## 2024-05-20 DIAGNOSIS — R042 Hemoptysis: Secondary | ICD-10-CM

## 2024-05-20 DIAGNOSIS — R0602 Shortness of breath: Secondary | ICD-10-CM | POA: Diagnosis not present

## 2024-05-21 ENCOUNTER — Other Ambulatory Visit: Payer: Self-pay | Admitting: Physician Assistant

## 2024-05-21 DIAGNOSIS — R042 Hemoptysis: Secondary | ICD-10-CM

## 2024-05-22 ENCOUNTER — Ambulatory Visit
Admission: RE | Admit: 2024-05-22 | Discharge: 2024-05-22 | Disposition: A | Discharge status: Home / Self Care | Source: Ambulatory Visit | Attending: Physician Assistant | Admitting: Physician Assistant

## 2024-05-22 ENCOUNTER — Other Ambulatory Visit

## 2024-05-22 DIAGNOSIS — J439 Emphysema, unspecified: Secondary | ICD-10-CM | POA: Diagnosis not present

## 2024-05-22 DIAGNOSIS — R911 Solitary pulmonary nodule: Secondary | ICD-10-CM | POA: Diagnosis not present

## 2024-05-22 DIAGNOSIS — R042 Hemoptysis: Secondary | ICD-10-CM

## 2024-05-23 ENCOUNTER — Other Ambulatory Visit: Payer: Self-pay

## 2024-05-23 ENCOUNTER — Ambulatory Visit: Payer: Self-pay

## 2024-05-23 ENCOUNTER — Emergency Department (HOSPITAL_COMMUNITY)

## 2024-05-23 ENCOUNTER — Emergency Department (HOSPITAL_COMMUNITY)
Admission: EM | Admit: 2024-05-23 | Discharge: 2024-05-23 | Disposition: A | Discharge status: Home / Self Care | Attending: Emergency Medicine | Admitting: Emergency Medicine

## 2024-05-23 ENCOUNTER — Encounter (HOSPITAL_COMMUNITY): Payer: Self-pay

## 2024-05-23 ENCOUNTER — Encounter (HOSPITAL_BASED_OUTPATIENT_CLINIC_OR_DEPARTMENT_OTHER): Payer: Self-pay

## 2024-05-23 ENCOUNTER — Ambulatory Visit (HOSPITAL_BASED_OUTPATIENT_CLINIC_OR_DEPARTMENT_OTHER)

## 2024-05-23 ENCOUNTER — Other Ambulatory Visit (HOSPITAL_BASED_OUTPATIENT_CLINIC_OR_DEPARTMENT_OTHER): Payer: Self-pay | Admitting: Internal Medicine

## 2024-05-23 DIAGNOSIS — R042 Hemoptysis: Secondary | ICD-10-CM | POA: Diagnosis not present

## 2024-05-23 DIAGNOSIS — I2699 Other pulmonary embolism without acute cor pulmonale: Secondary | ICD-10-CM | POA: Diagnosis not present

## 2024-05-23 DIAGNOSIS — J439 Emphysema, unspecified: Secondary | ICD-10-CM | POA: Diagnosis not present

## 2024-05-23 DIAGNOSIS — R0602 Shortness of breath: Secondary | ICD-10-CM | POA: Diagnosis not present

## 2024-05-23 DIAGNOSIS — J432 Centrilobular emphysema: Secondary | ICD-10-CM | POA: Diagnosis not present

## 2024-05-23 DIAGNOSIS — J40 Bronchitis, not specified as acute or chronic: Secondary | ICD-10-CM | POA: Insufficient documentation

## 2024-05-23 DIAGNOSIS — Z7982 Long term (current) use of aspirin: Secondary | ICD-10-CM | POA: Diagnosis not present

## 2024-05-23 DIAGNOSIS — K7689 Other specified diseases of liver: Secondary | ICD-10-CM | POA: Diagnosis not present

## 2024-05-23 DIAGNOSIS — R918 Other nonspecific abnormal finding of lung field: Secondary | ICD-10-CM | POA: Diagnosis not present

## 2024-05-23 LAB — BASIC METABOLIC PANEL WITH GFR
Anion gap: 12 (ref 5–15)
BUN: 16 mg/dL (ref 8–23)
CO2: 23 mmol/L (ref 22–32)
Calcium: 9.4 mg/dL (ref 8.9–10.3)
Chloride: 104 mmol/L (ref 98–111)
Creatinine, Ser: 1.34 mg/dL — ABNORMAL HIGH (ref 0.61–1.24)
GFR, Estimated: 60 mL/min — ABNORMAL LOW (ref >60)
Glucose, Bld: 92 mg/dL (ref 70–99)
Potassium: 3.8 mmol/L (ref 3.5–5.1)
Sodium: 139 mmol/L (ref 135–145)

## 2024-05-23 LAB — CBC
HCT: 42.1 % (ref 39.0–52.0)
Hemoglobin: 14.1 g/dL (ref 13.0–17.0)
MCH: 29.4 pg (ref 26.0–34.0)
MCHC: 33.5 g/dL (ref 30.0–36.0)
MCV: 87.7 fL (ref 80.0–100.0)
Platelets: 247 K/uL (ref 150–400)
RBC: 4.8 MIL/uL (ref 4.22–5.81)
RDW: 11.9 % (ref 11.5–15.5)
WBC: 8.6 K/uL (ref 4.0–10.5)
nRBC: 0 % (ref 0.0–0.2)

## 2024-05-23 MED ORDER — IOHEXOL 350 MG/ML SOLN
75.0000 mL | Freq: Once | INTRAVENOUS | Status: AC | PRN
  Administered 2024-05-23: 75 mL via INTRAVENOUS

## 2024-05-23 MED ORDER — AMOXICILLIN-POT CLAVULANATE 875-125 MG PO TABS: 1.0000 | ORAL_TABLET | Freq: Two times a day (BID) | ORAL | 0 refills | Status: AC

## 2024-05-23 MED ORDER — AMOXICILLIN-POT CLAVULANATE 875-125 MG PO TABS: 1.0000 | ORAL_TABLET | Freq: Once | ORAL | Status: DC

## 2024-05-23 MED ORDER — AZITHROMYCIN 250 MG PO TABS: 250.0000 mg | ORAL_TABLET | Freq: Every day | ORAL | 0 refills | Status: DC

## 2024-05-23 MED ORDER — DEXAMETHASONE 4 MG PO TABS: 10.0000 mg | ORAL_TABLET | Freq: Once | ORAL | Status: DC

## 2024-05-23 NOTE — Discharge Instructions (Signed)
 I will treat you like you have pneumonia.  Please return for worsening difficulty breathing or confusion.  Please follow-up with your family doctor in the office.  I would schedule an appointment with the pulmonologist.  They may want to reimage you to ensure this is improved and if not may consider doing further tests.  You also had a lung nodule that was seen on your CAT scan done recently.  This usually gets reimaged depending on your risk.  Please discuss this with the pulmonologist or PCP.

## 2024-05-23 NOTE — ED Provider Triage Note (Signed)
 Emergency Medicine Provider Triage Evaluation Note  Ernest [name] , a 63 y.o. male  was evaluated in triage.  Pt complains of SOB and hemoptysis. Started last Monday. Denies fever, chest pain, nausea, vomiting, diarrhea.  Review of Systems  Positive:  Negative:   Physical Exam  BP (!) 162/100 (BP Location: Right Arm)   Pulse (!) 112   Temp 98.2 F (36.8 C)   Resp (!) 22   Ht 5' 9 (1.753 m)   Wt 83.5 kg   SpO2 95%   BMI 27.17 kg/m  Gen:   Awake, no distress   Resp:  Normal effort  MSK:   Moves extremities without difficulty  Other:    Medical Decision Making  Medically screening exam initiated at 12:59 PM.  Appropriate orders placed.  AESON SAWYERS was informed that the remainder of the evaluation will be completed by another provider, this initial triage assessment does not replace that evaluation, and the importance of remaining in the ED until their evaluation is complete.    Hoy Fraction F, [place] 05/23/24 1300

## 2024-05-23 NOTE — ED Provider Notes (Signed)
 [place] EMERGENCY DEPARTMENT AT [facility] Provider Note   CSN: [id number] Arrival date & time: 05/23/24  1223     Patient presents with: Hemoptysis   Ernest [name] is a 63 y.o. male.   79 yoM with a chief complaints of hemoptysis.  This been going on for about a week.  He said he had been feeling fine but just noticed that he would cough up a little bits of blood.  He ended up going to his family doctor and they obtained a chest x-ray and then blood work and then he got a CT scan.  He had called back today and they could not find the records that his CT scan was done and told him that need to be rescheduled.  He had also been referred to pulmonology and he called their clinic and they suggest that he come to the ED for evaluation.  He denies any chest pain or difficulty breathing denies feel he might pass out.  Denies leg swelling.  Denies fevers.        Prior to Admission medications   Medication Sig Start Date End Date Taking? Authorizing Provider  amoxicillin -clavulanate (AUGMENTIN ) 875-125 MG tablet Take 1 tablet by mouth every 12 (twelve) hours. 05/23/24  Yes Emil Share, DO  azithromycin  (ZITHROMAX ) 250 MG tablet Take 1 tablet (250 mg total) by mouth daily. Take first 2 tablets together, then 1 every day until finished. 05/23/24  Yes Emil Share, DO  amLODipine-olmesartan (AZOR) 5-20 MG tablet Take 1 tablet by mouth daily. 04/15/19   [provider]  aspirin 81 MG chewable tablet Chew 81 mg by mouth daily.     [provider]  buPROPion (WELLBUTRIN XL) 150 MG 24 hr tablet Take 450 mg by mouth every morning. 08/03/21   [provider]  Cholecalciferol 50 MCG (2000 UT) TABS Take 2,000 Units by mouth daily at 6 (six) AM. 05/04/20   [provider]  hydrochlorothiazide (HYDRODIURIL) 12.5 MG tablet Take 12.5 mg by mouth 2 (two) times daily.    [provider]  Omega-3 Fatty Acids (FISH OIL ) 1000 MG CAPS Take 2 capsules (2,000 mg  total) by mouth 2 (two) times daily. 01/21/14   Dann Candyce GORMAN, MD  rosuvastatin  (CRESTOR ) 20 MG tablet Take 1 tablet (20 mg total) by mouth daily. 03/13/24   Jeffrie Oneil BROCKS, MD  SYNTHROID 100 MCG tablet Take 100 mcg by mouth daily. 05/16/20   [provider]  VIAGRA 100 MG tablet Take 100 mg by mouth daily as needed for erectile dysfunction.  12/31/15   [provider]    Allergies: Patient has no known allergies.    Review of Systems  Updated Vital Signs BP 118/82   Pulse 74   Temp 98.2 F (36.8 C)   Resp 20   Ht 5' 9 (1.753 m)   Wt 83.5 kg   SpO2 99%   BMI 27.17 kg/m   Physical Exam Vitals and nursing note reviewed.  Constitutional:      Appearance: He is well-developed.  HENT:     Head: Normocephalic and atraumatic.  Eyes:     Pupils: Pupils are equal, round, and reactive to light.  Neck:     Vascular: No JVD.  Cardiovascular:     Rate and Rhythm: Normal rate and regular rhythm.     Heart sounds: No murmur heard.    No friction rub. No gallop.  Pulmonary:     Effort: No respiratory distress.  Breath sounds: Rhonchi present. No wheezing.     Comments: Left lower lobe rhonchi.  Faint end expiratory wheezes Abdominal:     General: There is no distension.     Tenderness: There is no abdominal tenderness. There is no guarding or rebound.  Musculoskeletal:        General: Normal range of motion.     Cervical back: Normal range of motion and neck supple.  Skin:    Coloration: Skin is not pale.     Findings: No rash.  Neurological:     Mental Status: He is alert and oriented to person, place, and time.  Psychiatric:        Behavior: Behavior normal.     (all labs ordered are listed, but only abnormal results are displayed) Labs Reviewed  BASIC METABOLIC PANEL WITH GFR - Abnormal; Notable for the following components:      Result Value   Creatinine, Ser 1.34 (*)    GFR, Estimated 60 (*)    All other components within normal limits  CBC     EKG: None  Radiology: CT Angio Chest PE W/Cm &/Or Wo Cm Result Date: 05/23/2024 CLINICAL DATA:  Hemoptysis. Short of breath. High probability for pulmonary embolism. EXAM: CT ANGIOGRAPHY CHEST WITH CONTRAST TECHNIQUE: Multidetector CT imaging of the chest was performed using the standard protocol during bolus administration of intravenous contrast. Multiplanar CT image reconstructions and MIPs were obtained to evaluate the vascular anatomy. RADIATION DOSE REDUCTION: This exam was performed according to the departmental dose-optimization program which includes automated exposure control, adjustment of the mA and/or kV according to patient size and/or use of iterative reconstruction technique. CONTRAST:  75mL OMNIPAQUE  IOHEXOL  350 MG/ML SOLN COMPARISON:  None Available. FINDINGS: Cardiovascular: No filling defects within the pulmonary arteries to suggest acute pulmonary embolism. Mediastinum/Nodes: No axillary or supraclavicular adenopathy. No mediastinal or hilar adenopathy. No pericardial fluid. Esophagus normal. Lungs/Pleura: No pulmonary infarction. No pneumonia. No pleural fluid. No pneumothorax There is subtle airspace densities in the medial aspect of the LEFT upper lobe (image 55/6) for example). peribronchial airspace densities in the LEFT lower lobe. These findings are mildly progressive from CT chest 1 day prior. Centrilobular emphysema the upper lobes. Upper Abdomen: Limited view of the liver, kidneys, pancreas are unremarkable. Normal adrenal glands. Nodule enlargement of the LEFT adrenal gland consistent benign adenoma. Musculoskeletal: No aggressive osseous lesion. Review of the MIP images confirms the above findings. IMPRESSION: 1. No evidence acute pulmonary embolism. 2. Mild increase in peribronchial airspace densities in the LEFT upper lobe and LEFT lower lobe. Findings suggest mild bronchopneumonia versus atypical infection. 3.  Emphysema (ICD10-J43.9). Electronically Signed   By:  Jackquline Boxer M.D.   On: 05/23/2024 14:27   CT CHEST WO CONTRAST Result Date: 05/23/2024 CLINICAL DATA:  Blood in sputum.  Shortness of breath. EXAM: CT CHEST WITHOUT CONTRAST TECHNIQUE: Multidetector CT imaging of the chest was performed following the standard protocol without IV contrast. RADIATION DOSE REDUCTION: This exam was performed according to the departmental dose-optimization program which includes automated exposure control, adjustment of the mA and/or kV according to patient size and/or use of iterative reconstruction technique. COMPARISON:  05/20/2024 and CT chest 11/30/2022 FINDINGS: Cardiovascular: Coronary, aortic arch, and branch vessel atherosclerotic vascular disease. Mediastinum/Nodes: Unremarkable Lungs/Pleura: Biapical pleuroparenchymal scarring. Emphysema. Mild scarring or atelectasis in the right lower lobe posteriorly. Mild bilateral airway thickening. Stable subpleural scarring peripherally in the left upper lobe on image 48 series 5. 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 Small areas of involvement noted in the left upper lobe and left lower lobe. Peripheral left lower lobe 0.9 by 0.8 cm subsolid nodule on image 84 series 5, previously 0.8 cm diameter on 11/30/2022 by my measurements. Upper Abdomen: Abdominal aortic atherosclerosis. 2.1 by 1.6 cm left adrenal mass with internal density -11 Hounsfield units compatible with adrenal adenoma. No further imaging workup of this lesion is indicated. Musculoskeletal: T11 anterior wedge compression fracture is chronic and unchanged from 11/30/2022. IMPRESSION: 1. 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 2. Peripheral left lower lobe 0.9 by 0.8 cm subsolid nodule, previously 0.8 cm diameter on 11/30/2022 by my measurements. No substantial solid component. This could be inflammatory or neoplastic. Repeat CT is recommended every 2 years until 5 years of stability has  been established. This recommendation follows the consensus statement: Guidelines for Management of Incidental Pulmonary Nodules Detected on CT Images: From the Fleischner Society 2017; Radiology 2017; 284:228-243. 3. Left adrenal adenoma. No further imaging workup of this lesion is indicated. 4. Chronic T11 anterior wedge compression fracture. 5. Aortic Atherosclerosis (ICD10-I70.0) and Emphysema (ICD10-J43.9). Electronically Signed   By: Ryan Salvage M.D.   On: 05/23/2024 13:14   DG Chest 2 View Result Date: 05/23/2024 CLINICAL DATA:  Shortness of breath, blood in sputum EXAM: CHEST - 2 VIEW COMPARISON:  05/20/2024 FINDINGS: Emphysema. Cardiac and mediastinal margins appear normal. No blunting of the costophrenic angles. Rotator cuff anchors along the right humerus. IMPRESSION: 1.  Emphysema (ICD10-J43.9). Electronically Signed   By: Ryan Salvage M.D.   On: 05/23/2024 13:08     Procedures   Medications Ordered in the ED  dexamethasone  (DECADRON ) tablet 10 mg (has no administration in time range)  amoxicillin -clavulanate (AUGMENTIN ) 875-125 MG per tablet 1 tablet (has no administration in time range)  iohexol  (OMNIPAQUE ) 350 MG/ML injection 75 mL (75 mLs Intravenous Contrast Given 05/23/24 1408)                                    Medical Decision Making Amount and/or Complexity of Data Reviewed Labs: ordered. Radiology: ordered.  Risk Prescription drug management.   63 yo M with a chief complaints of hemoptysis.  Going on for about a week.  Patient had a CT scan that as an outpatient sounds like there was some difficulty finding the records by the primary care clinic.  He was encouraged to come here by pulmonology.  He was seen in the MSE process.  CT angiogram of the chest was ordered.  Shows likely bronchitis versus pneumonia.  Will start on antibiotics.  Old records reviewed, I see the recent patient visit.  I can see the CT scan and radiology read.  He also had a lung  nodule noted on that CT.  Discussed with patient for follow-up.  No anemia no significant electrolyte abnormalities  3:34 PM:  I have discussed the diagnosis/risks/treatment options with the patient and family.  Evaluation and diagnostic testing in the emergency department does not suggest an emergent condition requiring admission or immediate intervention beyond what has been performed at this time.  They will follow up with PCP. We also discussed returning to the ED immediately if new or worsening sx occur. We discussed the sx which are most concerning (e.g., sudden worsening pain, fever, inability to tolerate by mouth) that necessitate immediate return. Medications administered to the patient during their visit  and any new prescriptions provided to the patient are listed below.  Medications given during this visit Medications  dexamethasone  (DECADRON ) tablet 10 mg (has no administration in time range)  amoxicillin -clavulanate (AUGMENTIN ) 875-125 MG per tablet 1 tablet (has no administration in time range)  iohexol  (OMNIPAQUE ) 350 MG/ML injection 75 mL (75 mLs Intravenous Contrast Given 05/23/24 1408)     The patient appears reasonably screen and/or stabilized for discharge and I doubt any other medical condition or other [facility] requiring further screening, evaluation, or treatment in the ED at this time prior to discharge.       Final diagnoses:  Bronchitis    ED Discharge Orders          Ordered    amoxicillin -clavulanate (AUGMENTIN ) 875-125 MG tablet  Every 12 hours        05/23/24 1529    azithromycin  (ZITHROMAX ) 250 MG tablet  Daily        05/23/24 1529               Emil Share, DO 05/23/24 1534

## 2024-05-23 NOTE — Telephone Encounter (Signed)
 FYI Only or Action Required?: FYI only for provider.  Patient is followed in Pulmonology for New Pt  Called Nurse Triage reporting Hemoptysis and Referral.  Symptoms began several days ago.  Interventions attempted: Nothing.  Symptoms are: unchanged.  Triage Disposition: Call EMS 911 Now  Patient/caregiver understands and will follow disposition?: Unsure      Copied from CRM [id number]. Topic: Clinical - Red Word Triage >> May 23, 2024 11:25 AM Rilla NOVAK wrote: [facility] that prompted transfer to Nurse Triage: Coughing up blood Reason for Disposition  [1] Coughed up blood AND [2] large amount (Such as: a half cup of blood)  Answer Assessment - Initial Assessment Questions 1. ONSET: When did the cough begin?      Monday 2. SEVERITY: How bad is the cough today? Did the blood appear after a coughing spell?      *No Answer* 3. SPUTUM: Describe the color of your sputum (e.g., none, dry cough; clear, white, yellow, green)     *No Answer* 4. HEMOPTYSIS: How much blood? (e.g., flecks, streaks, tablespoons)     I can spit up a bunch of blood - bright red 5. DIFFICULTY BREATHING: Are you having difficulty breathing? If Yes, ask: How bad is it? (e.g., mild, moderate, severe)      *No Answer* 6. FEVER: Do you have a fever? If Yes, ask: What is your temperature, how was it measured, and when did it start?     *No Answer* 7. CARDIAC HISTORY: Do you have any history of heart disease? (e.g., heart attack, congestive heart failure)      *No Answer* 8. LUNG HISTORY: Do you have any history of lung disease?  (e.g., pulmonary embolus, asthma, emphysema)     *No Answer* 9. PE RISK FACTORS: Do you have a history of blood clots? Note: Other risk factors include recent major surgery, recent prolonged travel, being bedridden.     *No Answer* 10. OTHER SYMPTOMS: Do you have any other symptoms? (e.g., runny nose, wheezing, chest pain)       *No Answer* 11. PREGNANCY:  Is there any chance you are pregnant? When was your last menstrual period?       *No Answer* 12. TRAVEL: Have you traveled out of the country in the last month? (e.g., travel history, exposures)       *No Answer*    Pt has been following with non-[facility] PCP since the beginning of this week. Pt reports that he has had scans/diagnostics and was called in a z-pack today. Pt looked in his pt portal and noticed PCP refer to [facility] so called to schedule New Pt appt. Upon further investigation, pt is having significant hemoptysis so [facility] advised ED/F/U with PCP since he is not an established pt yet. Patient verbalized understanding.  Protocols used: Coughing Up Blood-A-AH

## 2024-05-23 NOTE — ED Triage Notes (Signed)
 Pt c/o coughing up blood x 5 days. Pt has seen PCP for same, had CT scan done, states they were unable to find it and referred pt here. Pt denies pain, nausea or vomiting. Pt states he is unable to lay down d/t having to sit up, has had increasing shortness of breath.

## 2024-05-28 ENCOUNTER — Encounter: Payer: Self-pay | Admitting: Internal Medicine

## 2024-05-28 ENCOUNTER — Ambulatory Visit (INDEPENDENT_AMBULATORY_CARE_PROVIDER_SITE_OTHER): Admitting: Internal Medicine

## 2024-05-28 VITALS — BP 110/64 | HR 83 | Temp 97.9°F | Ht 70.0 in | Wt 186.4 lb

## 2024-05-28 DIAGNOSIS — R042 Hemoptysis: Secondary | ICD-10-CM

## 2024-05-28 MED ORDER — ACETAMINOPHEN-CODEINE 300-30 MG PO TABS: 1.0000 | ORAL_TABLET | ORAL | 0 refills | Status: DC | PRN

## 2024-05-28 NOTE — Patient Instructions (Addendum)
 For cough / congestion > mucinex 1200 mg every 12 hours and up to one whole tylenol  #3 every 4 hours as needed  Try prilosec otc 20mg   Take 30-60 min before first meal of the day and Pepcid  ac (famotidine ) 20 mg one after supper  until cough is completely gone for at least a week without the need for cough suppression  Finish prednisone and antibiotics   We will set you p with follow up here with one of our doctors who performs bronchoscopy to see if this is necessary

## 2024-05-28 NOTE — Progress Notes (Unsigned)
 Ernest [name], male    DOB: 1960/11/16   MRN: [id number]   Brief patient profile:  [id number]  quit smoking May 19 2024  referred to pulmonary clinic 05/28/2024 by Dr Charlott  for hemoptysis       Pt not previously seen by [facility] service.     History of Present Illness  05/28/2024  Pulmonary/ 1st office eval/Tiffanny Lamarche  rx zpak/ augmentin  Chief Complaint  Patient presents with   Hemoptysis    Coughing up blood x 1 week.  Amount is about a teaspoonful.  Notices this about once or twice a day.  Dyspnea:  MMRC1 = can walk nl pace, flat grade, can't hurry or go uphills or steps s sob   Cough: Aug 25  > abx seem some better no epistaxis  Sleep: able to lie if flat bed x one pillow x 3 days  SABA use: none  02 [id number]:[id number]    No obvious day to day or daytime pattern/variability or assoc excess/ purulent sputum or mucus plugs or hemoptysis or cp or chest tightness, subjective wheeze or overt sinus or hb symptoms.    Also denies any obvious fluctuation of symptoms with weather or environmental changes or other aggravating or alleviating factors except as outlined above   No unusual exposure hx or h/o childhood pna/ asthma or knowledge of premature birth.  Current Allergies, Complete Past Medical History, Past Surgical History, Family History, and Social History were reviewed in [employer] record.  ROS  The following are not active complaints unless bolded Hoarseness, sore throat, dysphagia, dental problems, itching, sneezing,  nasal congestion or discharge of excess mucus or purulent secretions, ear ache,   fever, chills, sweats, unintended wt loss or wt gain, classically pleuritic or exertional cp,  orthopnea pnd or arm/hand swelling  or leg swelling, presyncope, palpitations, abdominal pain, anorexia, nausea, vomiting, diarrhea  or change in bowel habits or change in bladder habits, change in stools or change in urine, dysuria, hematuria,  rash, arthralgias, visual complaints,  headache, numbness, weakness or ataxia or problems with walking or coordination,  change in mood or  memory.             Outpatient Medications Prior to Visit  Medication Sig Dispense Refill   amLODipine-olmesartan (AZOR) 5-20 MG tablet Take 1 tablet by mouth daily.     amoxicillin -clavulanate (AUGMENTIN ) 875-125 MG tablet Take 1 tablet by mouth every 12 (twelve) hours. 14 tablet 0   aspirin 81 MG chewable tablet Chew 81 mg by mouth daily.      buPROPion (WELLBUTRIN XL) 150 MG 24 hr tablet Take 450 mg by mouth every morning.     Cholecalciferol 50 MCG (2000 UT) TABS Take 2,000 Units by mouth daily at 6 (six) AM.     Omega-3 Fatty Acids (FISH OIL ) 1000 MG CAPS Take 2 capsules (2,000 mg total) by mouth 2 (two) times daily.  0   rosuvastatin  (CRESTOR ) 20 MG tablet Take 1 tablet (20 mg total) by mouth daily. 90 tablet 3   SYNTHROID 100 MCG tablet Take 100 mcg by mouth daily.     azithromycin  (ZITHROMAX ) 250 MG tablet Take 1 tablet (250 mg total) by mouth daily. Take first 2 tablets together, then 1 every day until finished. 6 tablet 0   hydrochlorothiazide (HYDRODIURIL) 12.5 MG tablet Take 12.5 mg by mouth 2 (two) times daily.     VIAGRA 100 MG tablet Take 100 mg by mouth daily as needed for erectile dysfunction.  6   No facility-administered medications prior to visit.    Past Medical History:  Diagnosis Date   History of colonoscopy 08/20/13   Hypertension    Mixed hyperlipidemia 01/21/2014      Objective:     BP 110/64   Pulse 83   Temp 97.9 F (36.6 C) (Oral)   Ht 5' 10 (1.778 m)   Wt 186 lb 6.4 oz (84.6 kg)   SpO2 96% Comment: room air  BMI 26.75 kg/m   SpO2: 96 % (room air)       Assessment

## 2024-05-29 ENCOUNTER — Telehealth (HOSPITAL_BASED_OUTPATIENT_CLINIC_OR_DEPARTMENT_OTHER): Payer: Self-pay

## 2024-05-29 DIAGNOSIS — R918 Other nonspecific abnormal finding of lung field: Secondary | ICD-10-CM | POA: Diagnosis not present

## 2024-05-29 DIAGNOSIS — J439 Emphysema, unspecified: Secondary | ICD-10-CM | POA: Diagnosis not present

## 2024-05-29 DIAGNOSIS — R042 Hemoptysis: Secondary | ICD-10-CM | POA: Insufficient documentation

## 2024-05-29 DIAGNOSIS — I10 Essential (primary) hypertension: Secondary | ICD-10-CM | POA: Diagnosis not present

## 2024-05-29 MED ORDER — ACETAMINOPHEN-CODEINE 300-30 MG PO TABS: 1.0000 | ORAL_TABLET | ORAL | 0 refills | Status: AC | PRN

## 2024-05-29 NOTE — Assessment & Plan Note (Signed)
 Onset May 19 2024 while on ASA 81 mg daily > resolved as of  1st week in Sept 2025  - CTa 05/23/24 no source identified   Comment:  He is better with empirical rx with broad abx but still coughing enough to irritate his airway and make him cough more in a cyclical fashion so rec suppress with mucinex dm/ tyl #3 prn and f/u with one of our providers to determine whether fob needed - for sure would hold off for now as FOB would just aggravate the cough further and likely cause more hemoptysis.  Discussed in detail all the  indications, usual  risks and alternatives  relative to the benefits with patient who agrees to proceed with conservative f/u as outlined           Each maintenance medication was reviewed in detail including emphasizing most importantly the difference between maintenance and prns and under what circumstances the prns are to be triggered using an action plan format where appropriate.  Total time for H and P, chart review, counseling, and generating customized AVS unique to this office visit / same day charting = 36 min new pt eval

## 2024-05-29 NOTE — Telephone Encounter (Signed)
 I can't send a control rx with Dx code in comments

## 2024-05-29 NOTE — Addendum Note (Signed)
 Addended by: DARLEAN SHARPER B on: 05/29/2024 05:27 PM   Modules accepted: Orders

## 2024-05-29 NOTE — Telephone Encounter (Unsigned)
 Copied from CRM [id number]. Topic: Clinical - Prescription Issue >> May 28, 2024  3:32 PM Joesph PARAS wrote: Reason for CRM: Patient's spouse calling in to state that pharmacy cannot fill tylenol  rx without a diagnosis code. Please attach code and resend if able.   Please update patient when change has been done. >> May 29, 2024  9:27 AM Benton KIDD wrote: Patient spouse is calling back concerning the medication that needs a diagnosis code . Please rech out to them concerning this information [phone]

## 2024-06-03 DIAGNOSIS — Z09 Encounter for follow-up examination after completed treatment for conditions other than malignant neoplasm: Secondary | ICD-10-CM | POA: Diagnosis not present

## 2024-06-03 DIAGNOSIS — L570 Actinic keratosis: Secondary | ICD-10-CM | POA: Diagnosis not present

## 2024-06-03 DIAGNOSIS — L814 Other melanin hyperpigmentation: Secondary | ICD-10-CM | POA: Diagnosis not present

## 2024-06-03 DIAGNOSIS — L821 Other seborrheic keratosis: Secondary | ICD-10-CM | POA: Diagnosis not present

## 2024-06-13 DIAGNOSIS — E782 Mixed hyperlipidemia: Secondary | ICD-10-CM | POA: Diagnosis not present

## 2024-06-13 LAB — LIPID PANEL

## 2024-06-14 LAB — LIPID PANEL
Chol/HDL Ratio: 3.3 ratio (ref 0.0–5.0)
Cholesterol, Total: 147 mg/dL (ref 100–199)
HDL: 44 mg/dL (ref >39)
LDL Chol Calc (NIH): 81 mg/dL (ref 0–99)
Triglycerides: 122 mg/dL (ref 0–149)
VLDL Cholesterol Cal: 22 mg/dL (ref 5–40)

## 2024-06-17 ENCOUNTER — Ambulatory Visit: Payer: Self-pay | Admitting: *Deleted

## 2024-06-17 DIAGNOSIS — Z79899 Other long term (current) drug therapy: Secondary | ICD-10-CM

## 2024-06-17 DIAGNOSIS — E782 Mixed hyperlipidemia: Secondary | ICD-10-CM

## 2024-06-23 ENCOUNTER — Other Ambulatory Visit: Payer: Self-pay

## 2024-06-23 MED ORDER — EZETIMIBE 10 MG PO TABS
10.0000 mg | ORAL_TABLET | Freq: Every day | ORAL | 3 refills | Status: DC
Start: 2024-06-23 — End: 2024-06-23

## 2024-06-23 MED ORDER — EZETIMIBE 10 MG PO TABS: 10.0000 mg | ORAL_TABLET | Freq: Every day | ORAL | 3 refills | Status: AC

## 2024-06-24 ENCOUNTER — Other Ambulatory Visit: Payer: Self-pay | Admitting: *Deleted

## 2024-06-24 DIAGNOSIS — Z79899 Other long term (current) drug therapy: Secondary | ICD-10-CM

## 2024-06-24 DIAGNOSIS — E782 Mixed hyperlipidemia: Secondary | ICD-10-CM

## 2024-07-09 ENCOUNTER — Encounter

## 2024-07-18 ENCOUNTER — Other Ambulatory Visit: Payer: Self-pay | Admitting: Medical Genetics

## 2024-07-18 DIAGNOSIS — Z006 Encounter for examination for normal comparison and control in clinical research program: Secondary | ICD-10-CM

## 2024-08-18 LAB — GENECONNECT MOLECULAR SCREEN: Genetic Analysis Overall Interpretation: NEGATIVE

## 2024-09-11 DIAGNOSIS — K529 Noninfective gastroenteritis and colitis, unspecified: Secondary | ICD-10-CM | POA: Diagnosis not present

## 2024-09-27 LAB — LIPID PANEL
Chol/HDL Ratio: 3.1 ratio (ref 0.0–5.0)
Cholesterol, Total: 129 mg/dL (ref 100–199)
HDL: 42 mg/dL
LDL Chol Calc (NIH): 62 mg/dL (ref 0–99)
Triglycerides: 143 mg/dL (ref 0–149)
VLDL Cholesterol Cal: 25 mg/dL (ref 5–40)

## 2024-09-29 ENCOUNTER — Ambulatory Visit: Payer: Self-pay | Admitting: Cardiology
